# Patient Record
Sex: Female | Born: 1990 | Race: White | Hispanic: No | Marital: Married | Smoking: Never smoker
Health system: Southern US, Community
[De-identification: ages and names within clinical notes are randomized; demographics above are authoritative.]

## PROBLEM LIST (undated history)

## (undated) DIAGNOSIS — R519 Headache, unspecified: Secondary | ICD-10-CM

## (undated) DIAGNOSIS — R51 Headache: Secondary | ICD-10-CM

## (undated) DIAGNOSIS — L709 Acne, unspecified: Secondary | ICD-10-CM

## (undated) HISTORY — DX: Acne, unspecified: L70.9

## (undated) HISTORY — DX: Headache: R51

## (undated) HISTORY — PX: REFRACTIVE SURGERY: SHX103

## (undated) HISTORY — DX: Headache, unspecified: R51.9

## (undated) HISTORY — PX: EYE SURGERY: SHX253

---
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
## 2012-01-18 ENCOUNTER — Emergency Department (HOSPITAL_COMMUNITY)
Admission: EM | Admit: 2012-01-18 | Discharge: 2012-01-18 | Disposition: A | Discharge status: Home / Self Care | Payer: BC Managed Care – PPO | Source: Home / Self Care | Attending: Emergency Medicine | Admitting: Emergency Medicine

## 2013-01-31 HISTORY — PX: BUNIONECTOMY: SHX129

## 2013-04-06 ENCOUNTER — Other Ambulatory Visit: Payer: Self-pay | Admitting: Certified Nurse Midwife

## 2013-04-06 ENCOUNTER — Encounter: Payer: Self-pay | Admitting: Certified Nurse Midwife

## 2013-04-06 ENCOUNTER — Ambulatory Visit (INDEPENDENT_AMBULATORY_CARE_PROVIDER_SITE_OTHER): Payer: BC Managed Care – PPO | Admitting: Certified Nurse Midwife

## 2013-04-06 VITALS — BP 94/60 | HR 60 | Resp 16 | Ht 65.25 in | Wt 107.0 lb

## 2013-04-06 DIAGNOSIS — Z01419 Encounter for gynecological examination (general) (routine) without abnormal findings: Secondary | ICD-10-CM

## 2013-04-06 DIAGNOSIS — Z0000 Encounter for general adult medical examination without abnormal findings: Secondary | ICD-10-CM

## 2013-04-06 DIAGNOSIS — Z309 Encounter for contraceptive management, unspecified: Secondary | ICD-10-CM

## 2013-04-06 MED ORDER — NORGESTIM-ETH ESTRAD TRIPHASIC 0.18/0.215/0.25 MG-25 MCG PO TABS: 1.0000 | ORAL_TABLET | Freq: Every day | ORAL | Status: DC

## 2013-04-06 NOTE — Progress Notes (Signed)
22 y.o. G0P0000 Single Caucasian Fe here for annual exam. Periods normal, no issues with contraception management. Desires OCP continuation. Desires STD screening, HIV,RPR only. Patient had GC, chlamydia previously with this partner. No other health issues today.  Patient's last menstrual period was 04/01/2013.          Sexually active: yes  The current method of family planning is OCP (estrogen/progesterone).    Exercising: yes  running & other cardio Smoker:  no  Health Maintenance: Pap:  2013 normal no history of abnormal MMG:  none Colonoscopy:  none BMD:   none TDaP:  2009 Labs: none Self breast exam: not done   reports that she has never smoked. She has never used smokeless tobacco. She reports that she drinks about 1.0 ounces of alcohol per week. She reports that she does not use illicit drugs.  History reviewed. No pertinent past medical history.  Past Surgical History  Procedure Laterality Date  . Bunionectomy Right 10/14  . Refractive surgery      Current Outpatient Prescriptions  Medication Sig Dispense Refill  . Norgestimate-Ethinyl Estradiol Triphasic (ORTHO TRI-CYCLEN LO) 0.18/0.215/0.25 MG-25 MCG tab Take 1 tablet by mouth daily.       No current facility-administered medications for this visit.    Family History  Problem Relation Age of Onset  . Thyroid disease Mother   . Osteoporosis Maternal Grandmother   . Cancer Maternal Grandfather     prostate  . Stroke Maternal Grandfather     ROS:  Pertinent items are noted in HPI.  Otherwise, a comprehensive ROS was negative.  Exam:   BP 94/60  Pulse 60  Resp 16  Ht 5' 5.25" (1.657 m)  Wt 107 lb (48.535 kg)  BMI 17.68 kg/m2  LMP 04/01/2013 Height: 5' 5.25" (165.7 cm)  Ht Readings from Last 3 Encounters:  04/06/13 5' 5.25" (1.657 m)    General appearance: alert, cooperative and appears stated age Head: Normocephalic, without obvious abnormality, atraumatic Neck: no adenopathy, supple, symmetrical,  trachea midline and thyroid normal to inspection and palpation and non-palpable Lungs: clear to auscultation bilaterally Breasts: normal appearance, no masses or tenderness, No nipple retraction or dimpling, No nipple discharge or bleeding, No axillary or supraclavicular adenopathy Heart: regular rate and rhythm Abdomen: soft, non-tender; no masses,  no organomegaly Extremities: extremities normal, atraumatic, no cyanosis or edema Skin: Skin color, texture, turgor normal. No rashes or lesions Lymph nodes: Cervical, supraclavicular, and axillary nodes normal. No abnormal inguinal nodes palpated Neurologic: Grossly normal   Pelvic: External genitalia:  no lesions              Urethra:  normal appearing urethra with no masses, tenderness or lesions              Bartholin's and Skene's: normal                 Vagina: normal appearing vagina with normal color and discharge, no lesions              Cervix: normal, non tender              Pap taken: yes Bimanual Exam:  Uterus:  normal size, contour, position, consistency, mobility, non-tender and anteflexed              Adnexa: normal adnexa and no mass, fullness, tenderness               Rectovaginal: Confirms  Anus:  normal sphincter tone, no lesions  A:  Well Woman with normal exam  Contraception desires OCP  STD screeening  P:   Reviewed health and wellness pertinent to exam  Rx Orthotricyclen Lo see order  Lab:HIV,RPR  Pap smear as per guidelines   pap smear taken today with reflex  counseled on STD prevention, HIV risk factors and prevention, use and side effects of OCP's, adequate intake of calcium and vitamin D, diet and exercise  return annually or prn  An After Visit Summary was printed and given to the patient.

## 2013-04-06 NOTE — Patient Instructions (Signed)
General topics  Next pap or exam is  due in 1 year Take a Women's multivitamin Take 1200 mg. of calcium daily - prefer dietary If any concerns in interim to call back  Breast Self-Awareness Practicing breast self-awareness may pick up problems early, prevent significant medical complications, and possibly save your life. By practicing breast self-awareness, you can become familiar with how your breasts look and feel and if your breasts are changing. This allows you to notice changes early. It can also offer you some reassurance that your breast health is good. One way to learn what is normal for your breasts and whether your breasts are changing is to do a breast self-exam. If you find a lump or something that was not present in the past, it is best to contact your caregiver right away. Other findings that should be evaluated by your caregiver include nipple discharge, especially if it is bloody; skin changes or reddening; areas where the skin seems to be pulled in (retracted); or new lumps and bumps. Breast pain is seldom associated with cancer (malignancy), but should also be evaluated by a caregiver. BREAST SELF-EXAM The best time to examine your breasts is 5 7 days after your menstrual period is over.  ExitCare Patient Information 2013 [employer].   Exercise to Stay Healthy Exercise helps you become and stay healthy. EXERCISE IDEAS AND TIPS Choose exercises that:  You enjoy.  Fit into your day. You do not need to exercise really hard to be healthy. You can do exercises at a slow or medium level and stay healthy. You can:  Stretch before and after working out.  Try yoga, Pilates, or tai chi.  Lift weights.  Walk fast, swim, jog, run, climb stairs, bicycle, dance, or rollerskate.  Take aerobic classes. Exercises that burn about 150 calories:  Running 1  miles in 15 minutes.  Playing volleyball for 45 to 60 minutes.  Washing and waxing a car for 45 to 60  minutes.  Playing touch football for 45 minutes.  Walking 1  miles in 35 minutes.  Pushing a stroller 1  miles in 30 minutes.  Playing basketball for 30 minutes.  Raking leaves for 30 minutes.  Bicycling 5 miles in 30 minutes.  Walking 2 miles in 30 minutes.  Dancing for 30 minutes.  Shoveling snow for 15 minutes.  Swimming laps for 20 minutes.  Walking up stairs for 15 minutes.  Bicycling 4 miles in 15 minutes.  Gardening for 30 to 45 minutes.  Jumping rope for 15 minutes.  Washing windows or floors for 45 to 60 minutes. Document Released: 05/22/2010 Document Revised: 07/12/2011 Document Reviewed: 05/22/2010 ExitCare Patient Information 2013 [employer].   Other topics ( that may be useful information):    Sexually Transmitted Disease Sexually transmitted disease (STD) refers to any infection that is passed from person to person during sexual activity. This may happen by way of saliva, semen, blood, vaginal mucus, or urine. Common STDs include:  Gonorrhea.  Chlamydia.  Syphilis.  HIV/AIDS.  Genital herpes.  Hepatitis B and C.  Trichomonas.  Human papillomavirus (HPV).  Pubic lice. CAUSES  An STD may be spread by bacteria, virus, or parasite. A person can get an STD by:  Sexual intercourse with an infected person.  Sharing sex toys with an infected person.  Sharing needles with an infected person.  Having intimate contact with the genitals, mouth, or rectal areas of an infected person. SYMPTOMS  Some people may not have any symptoms, but   they can still pass the infection to others. Different STDs have different symptoms. Symptoms include:  Painful or bloody urination.  Pain in the pelvis, abdomen, vagina, anus, throat, or eyes.  Skin rash, itching, irritation, growths, or sores (lesions). These usually occur in the genital or anal area.  Abnormal vaginal discharge.  Penile discharge in men.  Soft, flesh-colored skin growths in the  genital or anal area.  Fever.  Pain or bleeding during sexual intercourse.  Swollen glands in the groin area.  Yellow skin and eyes (jaundice). This is seen with hepatitis. DIAGNOSIS  To make a diagnosis, your caregiver may:  Take a medical history.  Perform a physical exam.  Take a specimen (culture) to be examined.  Examine a sample of discharge under a microscope.  Perform blood test TREATMENT   Chlamydia, gonorrhea, trichomonas, and syphilis can be cured with antibiotic medicine.  Genital herpes, hepatitis, and HIV can be treated, but not cured, with prescribed medicines. The medicines will lessen the symptoms.  Genital warts from HPV can be treated with medicine or by freezing, burning (electrocautery), or surgery. Warts may come back.  HPV is a virus and cannot be cured with medicine or surgery.However, abnormal areas may be followed very closely by your caregiver and may be removed from the cervix, vagina, or vulva through office procedures or surgery. If your diagnosis is confirmed, your recent sexual partners need treatment. This is true even if they are symptom-free or have a negative culture or evaluation. They should not have sex until their caregiver says it is okay. HOME CARE INSTRUCTIONS  All sexual partners should be informed, tested, and treated for all STDs.  Take your antibiotics as directed. Finish them even if you start to feel better.  Only take over-the-counter or prescription medicines for pain, discomfort, or fever as directed by your caregiver.  Rest.  Eat a balanced diet and drink enough fluids to keep your urine clear or pale yellow.  Do not have sex until treatment is completed and you have followed up with your caregiver. STDs should be checked after treatment.  Keep all follow-up appointments, Pap tests, and blood tests as directed by your caregiver.  Only use latex condoms and water-soluble lubricants during sexual activity. Do not use  petroleum jelly or oils.  Avoid alcohol and illegal drugs.  Get vaccinated for HPV and hepatitis. If you have not received these vaccines in the past, talk to your caregiver about whether one or both might be right for you.  Avoid risky sex practices that can break the skin. The only way to avoid getting an STD is to avoid all sexual activity.Latex condoms and dental dams (for oral sex) will help lessen the risk of getting an STD, but will not completely eliminate the risk. SEEK MEDICAL CARE IF:   You have a fever.  You have any new or worsening symptoms. Document Released: 07/10/2002 Document Revised: 07/12/2011 Document Reviewed: 07/17/2010 ExitCare Patient Information 2013 [employer].    Domestic Abuse You are being battered or abused if someone close to you hits, pushes, or physically hurts you in any way. You also are being abused if you are forced into activities. You are being sexually abused if you are forced to have sexual contact of any kind. You are being emotionally abused if you are made to feel worthless or if you are constantly threatened. It is important to remember that help is available. No one has the right to abuse you. PREVENTION OF FURTHER   ABUSE  Learn the warning signs of danger. This varies with situations but may include: the use of alcohol, threats, isolation from friends and family, or forced sexual contact. Leave if you feel that violence is going to occur.  If you are attacked or beaten, report it to the police so the abuse is documented. You do not have to press charges. The police can protect you while you or the attackers are leaving. Get the officer's name and badge number and a copy of the report.  Find someone you can trust and tell them what is happening to you: your caregiver, a nurse, clergy member, close friend or family member. Feeling ashamed is natural, but remember that you have done nothing wrong. No one deserves abuse. Document Released:  04/16/2000 Document Revised: 07/12/2011 Document Reviewed: 06/25/2010 ExitCare Patient Information 2013 [employer].    How Much is Too Much Alcohol? Drinking too much alcohol can cause injury, accidents, and health problems. These types of problems can include:   Car crashes.  Falls.  Family fighting (domestic violence).  Drowning.  Fights.  Injuries.  Burns.  Damage to certain organs.  Having a baby with birth defects. ONE DRINK CAN BE TOO MUCH WHEN YOU ARE:  Working.  Pregnant or breastfeeding.  Taking medicines. Ask your doctor.  Driving or planning to drive. If you or someone you know has a drinking problem, get help from a doctor.  Document Released: 02/13/2009 Document Revised: 07/12/2011 Document Reviewed: 02/13/2009 ExitCare Patient Information 2013 [employer].   Smoking Hazards Smoking cigarettes is extremely bad for your health. Tobacco smoke has over 200 known poisons in it. There are over 60 chemicals in tobacco smoke that cause cancer. Some of the chemicals found in cigarette smoke include:   Cyanide.  Benzene.  Formaldehyde.  Methanol (wood alcohol).  Acetylene (fuel used in welding torches).  Ammonia. Cigarette smoke also contains the poisonous gases nitrogen oxide and carbon monoxide.  Cigarette smokers have an increased risk of many serious medical problems and Smoking causes approximately:  90% of all lung cancer deaths in men.  80% of all lung cancer deaths in women.  90% of deaths from chronic obstructive lung disease. Compared with nonsmokers, smoking increases the risk of:  Coronary heart disease by 2 to 4 times.  Stroke by 2 to 4 times.  Men developing lung cancer by 23 times.  Women developing lung cancer by 13 times.  Dying from chronic obstructive lung diseases by 12 times.  . Smoking is the most preventable cause of death and disease in our society.  WHY IS SMOKING ADDICTIVE?  Nicotine is the chemical  agent in tobacco that is capable of causing addiction or dependence.  When you smoke and inhale, nicotine is absorbed rapidly into the bloodstream through your lungs. Nicotine absorbed through the lungs is capable of creating a powerful addiction. Both inhaled and non-inhaled nicotine may be addictive.  Addiction studies of cigarettes and spit tobacco show that addiction to nicotine occurs mainly during the teen years, when young people begin using tobacco products. WHAT ARE THE BENEFITS OF QUITTING?  There are many health benefits to quitting smoking.   Likelihood of developing cancer and heart disease decreases. Health improvements are seen almost immediately.  Blood pressure, pulse rate, and breathing patterns start returning to normal soon after quitting. QUITTING SMOKING   American Lung Association - 1-800-LUNGUSA  American Cancer Society - 1-800-ACS-2345 Document Released: 05/27/2004 Document Revised: 07/12/2011 Document Reviewed: 01/29/2009 ExitCare Patient Information 2013 ExitCare[employer]   LL[employer].   Stress Management Stress is a state of physical or mental tension that often results from changes in your life or normal routine. Some common causes of stress are:  Death of a loved one.  Injuries or severe illnesses.  Getting fired or changing jobs.  Moving into a new home. Other causes may be:  Sexual problems.  Business or financial losses.  Taking on a large debt.  Regular conflict with someone at home or at work.  Constant tiredness from lack of sleep. It is not just bad things that are stressful. It may be stressful to:  Win the lottery.  Get married.  Buy a new car. The amount of stress that can be easily tolerated varies from person to person. Changes generally cause stress, regardless of the types of change. Too much stress can affect your health. It may lead to physical or emotional problems. Too little stress (boredom) may also become stressful. SUGGESTIONS TO  REDUCE STRESS:  Talk things over with your family and friends. It often is helpful to share your concerns and worries. If you feel your problem is serious, you may want to get help from a professional counselor.  Consider your problems one at a time instead of lumping them all together. Trying to take care of everything at once may seem impossible. List all the things you need to do and then start with the most important one. Set a goal to accomplish 2 or 3 things each day. If you expect to do too many in a single day you will naturally fail, causing you to feel even more stressed.  Do not use alcohol or drugs to relieve stress. Although you may feel better for a short time, they do not remove the problems that caused the stress. They can also be habit forming.  Exercise regularly - at least 3 times per week. Physical exercise can help to relieve that "uptight" feeling and will relax you.  The shortest distance between despair and hope is often a good night's sleep.  Go to bed and get up on time allowing yourself time for appointments without being rushed.  Take a short "time-out" period from any stressful situation that occurs during the day. Close your eyes and take some deep breaths. Starting with the muscles in your face, tense them, hold it for a few seconds, then relax. Repeat this with the muscles in your neck, shoulders, hand, stomach, back and legs.  Take good care of yourself. Eat a balanced diet and get plenty of rest.  Schedule time for having fun. Take a break from your daily routine to relax. HOME CARE INSTRUCTIONS   Call if you feel overwhelmed by your problems and feel you can no longer manage them on your own.  Return immediately if you feel like hurting yourself or someone else. Document Released: 10/13/2000 Document Revised: 07/12/2011 Document Reviewed: 06/05/2007 ExitCare Patient Information 2013 ExitCare[employer].   

## 2013-04-07 LAB — RPR

## 2013-04-09 LAB — PAP, THINPREP RFLX HPV: COMMENTS:: 1

## 2013-04-09 NOTE — Progress Notes (Signed)
Note reviewed, agree with plan.  Lijah Bourque, MD  

## 2013-07-16 ENCOUNTER — Telehealth: Payer: Self-pay | Admitting: Certified Nurse Midwife

## 2013-07-16 DIAGNOSIS — Z309 Encounter for contraceptive management, unspecified: Secondary | ICD-10-CM

## 2013-07-16 NOTE — Telephone Encounter (Signed)
Patient requesting another co-pay exemption for Orthotricyclin Lo. The old exemption has expired.  Grace Zhang

## 2013-07-17 NOTE — Telephone Encounter (Signed)
Patient is waiting to hear back from a nurse regarding her co-pay exemption. Please call today. Patient says no one called her back yesterday.

## 2013-07-18 MED ORDER — ORTHO TRI-CYCLEN LO 0.18/0.215/0.25 MG-25 MCG PO TABS: 1.0000 | ORAL_TABLET | Freq: Every day | ORAL | Status: DC

## 2013-07-18 NOTE — Telephone Encounter (Signed)
Message left to return call to [place] at [phone].   Override obtained for 06/26/13-07/17/14. Case ID [id number] Patient had failure to [place] Loestrin and Lo Lo Estrin FE   Patient returned call she requested 90 day supply. 90 day supply sent to pharmacy.  Routing to provider for final review. Patient agreeable to disposition. Will close encounter

## 2013-07-24 ENCOUNTER — Telehealth: Payer: Self-pay

## 2013-07-24 NOTE — Telephone Encounter (Signed)
Called patient to let her know that her insurance has approved her orth tri-cyclen lo medication. [email or web address]

## 2013-07-26 NOTE — Telephone Encounter (Signed)
Left message for call back.

## 2013-07-26 NOTE — Telephone Encounter (Signed)
Pt notified & appreciates all of the help

## 2013-11-08 ENCOUNTER — Encounter: Payer: Self-pay | Admitting: Family Medicine

## 2013-11-08 ENCOUNTER — Ambulatory Visit (INDEPENDENT_AMBULATORY_CARE_PROVIDER_SITE_OTHER): Payer: BC Managed Care – PPO | Admitting: Family Medicine

## 2013-11-08 VITALS — BP 94/70 | HR 62 | Temp 98.3°F | Ht 65.0 in | Wt 106.5 lb

## 2013-11-08 DIAGNOSIS — H04129 Dry eye syndrome of unspecified lacrimal gland: Secondary | ICD-10-CM

## 2013-11-08 DIAGNOSIS — Z7689 Persons encountering health services in other specified circumstances: Secondary | ICD-10-CM

## 2013-11-08 DIAGNOSIS — Z3009 Encounter for other general counseling and advice on contraception: Secondary | ICD-10-CM

## 2013-11-08 DIAGNOSIS — L709 Acne, unspecified: Secondary | ICD-10-CM

## 2013-11-08 DIAGNOSIS — L708 Other acne: Secondary | ICD-10-CM

## 2013-11-08 DIAGNOSIS — M21611 Bunion of right foot: Secondary | ICD-10-CM | POA: Insufficient documentation

## 2013-11-08 DIAGNOSIS — L700 Acne vulgaris: Secondary | ICD-10-CM

## 2013-11-08 DIAGNOSIS — Z7189 Other specified counseling: Secondary | ICD-10-CM

## 2013-11-08 DIAGNOSIS — IMO0001 Reserved for inherently not codable concepts without codable children: Secondary | ICD-10-CM | POA: Insufficient documentation

## 2013-11-08 DIAGNOSIS — M21619 Bunion of unspecified foot: Secondary | ICD-10-CM

## 2013-11-08 DIAGNOSIS — H04123 Dry eye syndrome of bilateral lacrimal glands: Secondary | ICD-10-CM | POA: Insufficient documentation

## 2013-11-08 HISTORY — DX: Acne, unspecified: L70.9

## 2013-11-08 NOTE — Patient Instructions (Signed)
-  We have ordered labs or studies at this visit. It can take up to 1-2 weeks for results and processing. We will contact you with instructions IF your results are abnormal. Normal results will be released to your [facility]. If you have not heard from [place] or can not find your results in [facility] in 2 weeks please contact our office.  -PLEASE SIGN UP FOR MYCHART TODAY   We recommend the following healthy lifestyle measures: - eat a healthy diet consisting of lots of vegetables, fruits, beans, nuts, seeds, healthy meats such as white chicken and fish and whole grains.  - avoid fried foods, fast food, processed foods, sodas, red meet and other fattening foods.  - get a least 150 minutes of aerobic exercise per week.   Follow up in: 6 months for physical

## 2013-11-08 NOTE — Progress Notes (Signed)
Pre visit review using our clinic review tool, if applicable. No additional management support is needed unless otherwise documented below in the visit note. 

## 2013-11-08 NOTE — Progress Notes (Signed)
No chief complaint on file.   HPI:  Grace Zhang is here to establish care. Recently moved to [place] a few years ago. Last PCP and physical: sees gyn for yearly paps/physical.  Has the following chronic problems and concerns today:  Patient Active Problem List   Diagnosis Date Noted  . Acne - sees dermatologist 11/08/2013  . Contraception - sees gynecologist 11/08/2013  . Bunion of right foot - sees orthopedic foot doctor for this 11/08/2013  . Dry eyes - goes to Dr. Herbert Deaner optho for this 11/08/2013    ROS negative for unless reported above: fevers, unintentional weight loss, hearing or vision loss, chest pain, palpitations, struggling to breath, hemoptysis, melena, hematochezia, hematuria, fals, loc, si, thoughts of self harm  Past Medical History  Diagnosis Date  . Frequent headaches   . Acne - sees dermatologist 11/08/2013    Family History  Problem Relation Age of Onset  . Thyroid disease Mother   . Osteoporosis Maternal Grandmother   . Cancer Maternal Grandfather     prostate  . Stroke Maternal Grandfather     History   Social History  . Marital Status: Single    Spouse Name: N/A    Number of Children: N/A  . Years of Education: N/A   Social History Main Topics  . Smoking status: Never Smoker   . Smokeless tobacco: Never Used  . Alcohol Use: 1.0 oz/week    2 drink(s) per week     Comment: occ  . Drug Use: No  . Sexual Activity: Yes    Partners: Male    Birth Control/ Protection: Pill   Other Topics Concern  . None   Social History Narrative   Work or School: reporter for new station - [employer] Situation: lives alone      Spiritual Beliefs: Catholic      Lifestyle: runs 3- 4 times per week - 1-2 mile; diet is healthy - no body image concerns, anorexia or bulemia per her report                Current outpatient prescriptions:cycloSPORINE (RESTASIS) 0.05 % ophthalmic emulsion, 1 drop 2 (two) times daily., Disp: , Rfl: ;  ORTHO  TRI-CYCLEN LO 0.18/0.215/0.25 MG-25 MCG tab, Take 1 tablet by mouth daily., Disp: 3 Package, Rfl: 3  EXAM:  Filed Vitals:   11/08/13 1435  BP: 94/70  Pulse: 62  Temp: 98.3 F (36.8 C)    Body mass index is 17.72 kg/(m^2).  GENERAL: vitals reviewed and listed above, alert, oriented, appears well hydrated and in no acute distress  HEENT: atraumatic, conjunttiva clear, no obvious abnormalities on inspection of external nose and ears  NECK: no obvious masses on inspection  LUNGS: clear to auscultation bilaterally, no wheezes, rales or rhonchi, good air movement  CV: HRRR, no peripheral edema  MS: moves all extremities without noticeable abnormality  PSYCH: pleasant and cooperative, no obvious depression or anxiety  ASSESSMENT AND PLAN:  Discussed the following assessment and plan:  Acne vulgaris  Encounter for other general counseling or advice on contraception  Bunion of right foot - sees orthopedic foot doctor for this  Dry eyes, bilateral  Encounter to establish care  -We reviewed the PMH, PSH, FH, SH, Meds and Allergies. -We provided refills for any medications we will prescribe as needed. -We addressed current concerns per orders and patient instructions. -We have asked for records for pertinent exams, studies, vaccines and notes from previous providers. -We have  advised patient to follow up per instructions below.   -Patient advised to return or notify a doctor immediately if symptoms worsen or persist or new concerns arise.  Patient Instructions  -We have ordered labs or studies at this visit. It can take up to 1-2 weeks for results and processing. We will contact you with instructions IF your results are abnormal. Normal results will be released to your [facility]. If you have not heard from [place] or can not find your results in [facility] in 2 weeks please contact our office.  -PLEASE SIGN UP FOR MYCHART TODAY   We recommend the following healthy lifestyle  measures: - eat a healthy diet consisting of lots of vegetables, fruits, beans, nuts, seeds, healthy meats such as white chicken and fish and whole grains.  - avoid fried foods, fast food, processed foods, sodas, red meet and other fattening foods.  - get a least 150 minutes of aerobic exercise per week.   Follow up in: 6 months for physical      KIM, HANNAH R.

## 2013-12-13 ENCOUNTER — Telehealth: Payer: Self-pay | Admitting: Certified Nurse Midwife

## 2013-12-13 DIAGNOSIS — Z309 Encounter for contraceptive management, unspecified: Secondary | ICD-10-CM

## 2013-12-13 MED ORDER — ORTHO TRI-CYCLEN LO 0.18/0.215/0.25 MG-25 MCG PO TABS: 1.0000 | ORAL_TABLET | Freq: Every day | ORAL | Status: DC

## 2013-12-13 NOTE — Telephone Encounter (Signed)
Rx transferred to [facility] mail order pharmacy. Spoke with patient. Advised rx sent over to pharmacy of choice. Patient agreeable.  Routing to provider for final review. Patient agreeable to disposition. Will close encounter

## 2013-12-13 NOTE — Telephone Encounter (Signed)
°  Patient is requesting her [place] 0.18/0.215/0.25 MG-25 MCG tab called to her mail order pharmacy  [facility] [phone] option #2   Patient request a call when this has been called in to [facility] mail order.

## 2014-01-08 ENCOUNTER — Encounter: Payer: Self-pay | Admitting: Family Medicine

## 2014-03-06 ENCOUNTER — Encounter: Payer: Self-pay | Admitting: Family Medicine

## 2014-04-04 ENCOUNTER — Encounter: Payer: BC Managed Care – PPO | Admitting: Family Medicine

## 2014-04-04 NOTE — Progress Notes (Signed)
Error   This encounter was created in error - please disregard. 

## 2014-04-08 ENCOUNTER — Ambulatory Visit (INDEPENDENT_AMBULATORY_CARE_PROVIDER_SITE_OTHER): Payer: BC Managed Care – PPO | Admitting: Certified Nurse Midwife

## 2014-04-08 ENCOUNTER — Encounter: Payer: Self-pay | Admitting: Certified Nurse Midwife

## 2014-04-08 VITALS — BP 94/54 | HR 78 | Resp 18 | Ht 65.25 in | Wt 107.8 lb

## 2014-04-08 DIAGNOSIS — Z0000 Encounter for general adult medical examination without abnormal findings: Secondary | ICD-10-CM

## 2014-04-08 DIAGNOSIS — Z3041 Encounter for surveillance of contraceptive pills: Secondary | ICD-10-CM

## 2014-04-08 DIAGNOSIS — Z01419 Encounter for gynecological examination (general) (routine) without abnormal findings: Secondary | ICD-10-CM

## 2014-04-08 LAB — TSH: TSH: 3.171 u[IU]/mL (ref 0.350–4.500)

## 2014-04-08 LAB — HEMOGLOBIN, FINGERSTICK: Hemoglobin, fingerstick: 13.5 g/dL (ref 12.0–16.0)

## 2014-04-08 MED ORDER — ORTHO TRI-CYCLEN LO 0.18/0.215/0.25 MG-25 MCG PO TABS: 1.0000 | ORAL_TABLET | Freq: Every day | ORAL | Status: DC

## 2014-04-08 NOTE — Patient Instructions (Signed)
General topics  Next pap or exam is  due in 1 year Take a Women's multivitamin Take 1200 mg. of calcium daily - prefer dietary If any concerns in interim to call back  Breast Self-Awareness Practicing breast self-awareness may pick up problems early, prevent significant medical complications, and possibly save your life. By practicing breast self-awareness, you can become familiar with how your breasts look and feel and if your breasts are changing. This allows you to notice changes early. It can also offer you some reassurance that your breast health is good. One way to learn what is normal for your breasts and whether your breasts are changing is to do a breast self-exam. If you find a lump or something that was not present in the past, it is best to contact your caregiver right away. Other findings that should be evaluated by your caregiver include nipple discharge, especially if it is bloody; skin changes or reddening; areas where the skin seems to be pulled in (retracted); or new lumps and bumps. Breast pain is seldom associated with cancer (malignancy), but should also be evaluated by a caregiver. BREAST SELF-EXAM The best time to examine your breasts is 5 7 days after your menstrual period is over.  ExitCare Patient Information 2013 ExitCare, LLC.   Exercise to Stay Healthy Exercise helps you become and stay healthy. EXERCISE IDEAS AND TIPS Choose exercises that:  You enjoy.  Fit into your day. You do not need to exercise really hard to be healthy. You can do exercises at a slow or medium level and stay healthy. You can:  Stretch before and after working out.  Try yoga, Pilates, or tai chi.  Lift weights.  Walk fast, swim, jog, run, climb stairs, bicycle, dance, or rollerskate.  Take aerobic classes. Exercises that burn about 150 calories:  Running 1  miles in 15 minutes.  Playing volleyball for 45 to 60 minutes.  Washing and waxing a car for 45 to 60  minutes.  Playing touch football for 45 minutes.  Walking 1  miles in 35 minutes.  Pushing a stroller 1  miles in 30 minutes.  Playing basketball for 30 minutes.  Raking leaves for 30 minutes.  Bicycling 5 miles in 30 minutes.  Walking 2 miles in 30 minutes.  Dancing for 30 minutes.  Shoveling snow for 15 minutes.  Swimming laps for 20 minutes.  Walking up stairs for 15 minutes.  Bicycling 4 miles in 15 minutes.  Gardening for 30 to 45 minutes.  Jumping rope for 15 minutes.  Washing windows or floors for 45 to 60 minutes. Document Released: 05/22/2010 Document Revised: 07/12/2011 Document Reviewed: 05/22/2010 ExitCare Patient Information 2013 ExitCare, LLC.   Other topics ( that may be useful information):    Sexually Transmitted Disease Sexually transmitted disease (STD) refers to any infection that is passed from person to person during sexual activity. This may happen by way of saliva, semen, blood, vaginal mucus, or urine. Common STDs include:  Gonorrhea.  Chlamydia.  Syphilis.  HIV/AIDS.  Genital herpes.  Hepatitis B and C.  Trichomonas.  Human papillomavirus (HPV).  Pubic lice. CAUSES  An STD may be spread by bacteria, virus, or parasite. A person can get an STD by:  Sexual intercourse with an infected person.  Sharing sex toys with an infected person.  Sharing needles with an infected person.  Having intimate contact with the genitals, mouth, or rectal areas of an infected person. SYMPTOMS  Some people may not have any symptoms, but   they can still pass the infection to others. Different STDs have different symptoms. Symptoms include:  Painful or bloody urination.  Pain in the pelvis, abdomen, vagina, anus, throat, or eyes.  Skin rash, itching, irritation, growths, or sores (lesions). These usually occur in the genital or anal area.  Abnormal vaginal discharge.  Penile discharge in men.  Soft, flesh-colored skin growths in the  genital or anal area.  Fever.  Pain or bleeding during sexual intercourse.  Swollen glands in the groin area.  Yellow skin and eyes (jaundice). This is seen with hepatitis. DIAGNOSIS  To make a diagnosis, your caregiver may:  Take a medical history.  Perform a physical exam.  Take a specimen (culture) to be examined.  Examine a sample of discharge under a microscope.  Perform blood test TREATMENT   Chlamydia, gonorrhea, trichomonas, and syphilis can be cured with antibiotic medicine.  Genital herpes, hepatitis, and HIV can be treated, but not cured, with prescribed medicines. The medicines will lessen the symptoms.  Genital warts from HPV can be treated with medicine or by freezing, burning (electrocautery), or surgery. Warts may come back.  HPV is a virus and cannot be cured with medicine or surgery.However, abnormal areas may be followed very closely by your caregiver and may be removed from the cervix, vagina, or vulva through office procedures or surgery. If your diagnosis is confirmed, your recent sexual partners need treatment. This is true even if they are symptom-free or have a negative culture or evaluation. They should not have sex until their caregiver says it is okay. HOME CARE INSTRUCTIONS  All sexual partners should be informed, tested, and treated for all STDs.  Take your antibiotics as directed. Finish them even if you start to feel better.  Only take over-the-counter or prescription medicines for pain, discomfort, or fever as directed by your caregiver.  Rest.  Eat a balanced diet and drink enough fluids to keep your urine clear or pale yellow.  Do not have sex until treatment is completed and you have followed up with your caregiver. STDs should be checked after treatment.  Keep all follow-up appointments, Pap tests, and blood tests as directed by your caregiver.  Only use latex condoms and water-soluble lubricants during sexual activity. Do not use  petroleum jelly or oils.  Avoid alcohol and illegal drugs.  Get vaccinated for HPV and hepatitis. If you have not received these vaccines in the past, talk to your caregiver about whether one or both might be right for you.  Avoid risky sex practices that can break the skin. The only way to avoid getting an STD is to avoid all sexual activity.Latex condoms and dental dams (for oral sex) will help lessen the risk of getting an STD, but will not completely eliminate the risk. SEEK MEDICAL CARE IF:   You have a fever.  You have any new or worsening symptoms. Document Released: 07/10/2002 Document Revised: 07/12/2011 Document Reviewed: 07/17/2010 [facility] Patient Information 2013 [place].    Domestic Abuse You are being battered or abused if someone close to you hits, pushes, or physically hurts you in any way. You also are being abused if you are forced into activities. You are being sexually abused if you are forced to have sexual contact of any kind. You are being emotionally abused if you are made to feel worthless or if you are constantly threatened. It is important to remember that help is available. No one has the right to abuse you. PREVENTION OF FURTHER  ABUSE  Learn the warning signs of danger. This varies with situations but may include: the use of alcohol, threats, isolation from friends and family, or forced sexual contact. Leave if you feel that violence is going to occur.  If you are attacked or beaten, report it to the police so the abuse is documented. You do not have to press charges. The police can protect you while you or the attackers are leaving. Get the officer's name and badge number and a copy of the report.  Find someone you can trust and tell them what is happening to you: your caregiver, a nurse, clergy member, close friend or family member. Feeling ashamed is natural, but remember that you have done nothing wrong. No one deserves abuse. Document Released:  04/16/2000 Document Revised: 07/12/2011 Document Reviewed: 06/25/2010 ExitCare Patient Information 2013 ExitCare, LLC.    How Much is Too Much Alcohol? Drinking too much alcohol can cause injury, accidents, and health problems. These types of problems can include:   Car crashes.  Falls.  Family fighting (domestic violence).  Drowning.  Fights.  Injuries.  Burns.  Damage to certain organs.  Having a baby with birth defects. ONE DRINK CAN BE TOO MUCH WHEN YOU ARE:  Working.  Pregnant or breastfeeding.  Taking medicines. Ask your doctor.  Driving or planning to drive. If you or someone you know has a drinking problem, get help from a doctor.  Document Released: 02/13/2009 Document Revised: 07/12/2011 Document Reviewed: 02/13/2009 ExitCare Patient Information 2013 ExitCare, LLC.   Smoking Hazards Smoking cigarettes is extremely bad for your health. Tobacco smoke has over 200 known poisons in it. There are over 60 chemicals in tobacco smoke that cause cancer. Some of the chemicals found in cigarette smoke include:   Cyanide.  Benzene.  Formaldehyde.  Methanol (wood alcohol).  Acetylene (fuel used in welding torches).  Ammonia. Cigarette smoke also contains the poisonous gases nitrogen oxide and carbon monoxide.  Cigarette smokers have an increased risk of many serious medical problems and Smoking causes approximately:  90% of all lung cancer deaths in men.  80% of all lung cancer deaths in women.  90% of deaths from chronic obstructive lung disease. Compared with nonsmokers, smoking increases the risk of:  Coronary heart disease by 2 to 4 times.  Stroke by 2 to 4 times.  Men developing lung cancer by 23 times.  Women developing lung cancer by 13 times.  Dying from chronic obstructive lung diseases by 12 times.  . Smoking is the most preventable cause of death and disease in our society.  WHY IS SMOKING ADDICTIVE?  Nicotine is the chemical  agent in tobacco that is capable of causing addiction or dependence.  When you smoke and inhale, nicotine is absorbed rapidly into the bloodstream through your lungs. Nicotine absorbed through the lungs is capable of creating a powerful addiction. Both inhaled and non-inhaled nicotine may be addictive.  Addiction studies of cigarettes and spit tobacco show that addiction to nicotine occurs mainly during the teen years, when young people begin using tobacco products. WHAT ARE THE BENEFITS OF QUITTING?  There are many health benefits to quitting smoking.   Likelihood of developing cancer and heart disease decreases. Health improvements are seen almost immediately.  Blood pressure, pulse rate, and breathing patterns start returning to normal soon after quitting. QUITTING SMOKING   American Lung Association - 1-800-LUNGUSA  American Cancer Society - 1-800-ACS-2345 Document Released: 05/27/2004 Document Revised: 07/12/2011 Document Reviewed: 01/29/2009 ExitCare Patient Information 2013 ExitCare,   LLC.   Stress Management Stress is a state of physical or mental tension that often results from changes in your life or normal routine. Some common causes of stress are:  Death of a loved one.  Injuries or severe illnesses.  Getting fired or changing jobs.  Moving into a new home. Other causes may be:  Sexual problems.  Business or financial losses.  Taking on a large debt.  Regular conflict with someone at home or at work.  Constant tiredness from lack of sleep. It is not just bad things that are stressful. It may be stressful to:  Win the lottery.  Get married.  Buy a new car. The amount of stress that can be easily tolerated varies from person to person. Changes generally cause stress, regardless of the types of change. Too much stress can affect your health. It may lead to physical or emotional problems. Too little stress (boredom) may also become stressful. SUGGESTIONS TO  REDUCE STRESS:  Talk things over with your family and friends. It often is helpful to share your concerns and worries. If you feel your problem is serious, you may want to get help from a professional counselor.  Consider your problems one at a time instead of lumping them all together. Trying to take care of everything at once may seem impossible. List all the things you need to do and then start with the most important one. Set a goal to accomplish 2 or 3 things each day. If you expect to do too many in a single day you will naturally fail, causing you to feel even more stressed.  Do not use alcohol or drugs to relieve stress. Although you may feel better for a short time, they do not remove the problems that caused the stress. They can also be habit forming.  Exercise regularly - at least 3 times per week. Physical exercise can help to relieve that "uptight" feeling and will relax you.  The shortest distance between despair and hope is often a good night's sleep.  Go to bed and get up on time allowing yourself time for appointments without being rushed.  Take a short "time-out" period from any stressful situation that occurs during the day. Close your eyes and take some deep breaths. Starting with the muscles in your face, tense them, hold it for a few seconds, then relax. Repeat this with the muscles in your neck, shoulders, hand, stomach, back and legs.  Take good care of yourself. Eat a balanced diet and get plenty of rest.  Schedule time for having fun. Take a break from your daily routine to relax. HOME CARE INSTRUCTIONS   Call if you feel overwhelmed by your problems and feel you can no longer manage them on your own.  Return immediately if you feel like hurting yourself or someone else. Document Released: 10/13/2000 Document Revised: 07/12/2011 Document Reviewed: 06/05/2007 ExitCare Patient Information 2013 ExitCare, LLC.   

## 2014-04-08 NOTE — Progress Notes (Signed)
23 y.o. G0P0000 Single Caucasian Fe here for annual exam.  Periods normal. Contraception of OCP working well. No partner change, no STD concerns. On medication for dry eyes now with help with suspension. Sees urgent care if needed. Request TSH lab due to dry eyes and eye MD suggestion. No other health issues today.  Patient's last menstrual period was 04/01/2014.          Sexually active: Yes.    The current method of family planning is OCP (estrogen/progesterone).    Exercising: Yes.    Running 3-4x/wk Smoker:  no  Health Maintenance: Pap:  04/06/13 Neg TDaP:  09/01/2007  Labs: Hgb: 13.5 ; Urine: Declined    reports that she has never smoked. She has never used smokeless tobacco. She reports that she drinks about 1.2 - 1.8 oz of alcohol per week. She reports that she does not use illicit drugs.  Past Medical History  Diagnosis Date  . Frequent headaches   . Acne - sees dermatologist 11/08/2013    Past Surgical History  Procedure Laterality Date  . Bunionectomy Right 10/14  . Refractive surgery      Current Outpatient Prescriptions  Medication Sig Dispense Refill  . ORTHO TRI-CYCLEN LO 0.18/0.215/0.25 MG-25 MCG tab Take 1 tablet by mouth daily. 3 Package 3  . prednisoLONE acetate (PRED FORTE) 1 % ophthalmic suspension daily.  4   No current facility-administered medications for this visit.    Family History  Problem Relation Age of Onset  . Thyroid disease Mother   . Osteoporosis Maternal Grandmother   . Cancer Maternal Grandfather     prostate  . Stroke Maternal Grandfather     ROS:  Pertinent items are noted in HPI.  Otherwise, a comprehensive ROS was negative.  Exam:   Ht 5' 5.25" (1.657 m)  Wt 107 lb 12.8 oz (48.898 kg)  BMI 17.81 kg/m2  LMP 04/01/2014 Height: 5' 5.25" (165.7 cm)  Ht Readings from Last 3 Encounters:  04/08/14 5' 5.25" (1.657 m)  11/08/13 5\' 5"  (1.651 m)  04/06/13 5' 5.25" (1.657 m)    General appearance: alert, cooperative and appears stated  age Head: Normocephalic, without obvious abnormality, atraumatic Neck: no adenopathy, supple, symmetrical, trachea midline and thyroid normal to inspection and palpation and non-palpable Lungs: clear to auscultation bilaterally Breasts: normal appearance, no masses or tenderness, Inspection negative, No nipple retraction or dimpling, No nipple discharge or bleeding Heart: regular rate and rhythm Abdomen: soft, non-tender; no masses,  no organomegaly Extremities: extremities normal, atraumatic, no cyanosis or edema Skin: Skin color, texture, turgor normal. No rashes or lesions Lymph nodes: Cervical, supraclavicular, and axillary nodes normal. No abnormal inguinal nodes palpated Neurologic: Grossly normal   Pelvic: External genitalia:  no lesions              Urethra:  normal appearing urethra with no masses, tenderness or lesions              Bartholin's and Skene's: normal                 Vagina: normal appearing vagina with normal color and discharge, no lesions              Cervix: normal, non tender              Pap taken: No. Bimanual Exam:  Uterus:  normal size, contour, position, consistency, mobility, non-tender and anteverted              Adnexa: normal adnexa  and no mass, fullness, tenderness               Rectovaginal: Confirms               Anus:  normal sphincter tone, no lesions  A:  Well Woman with normal exam  Contraception OCP desired.  Dry eyes with emulsion therapy needs TSH  P:   Reviewed health and wellness pertinent to exam  Rx Ortho tricyclen Lo see order  Lab TSH  Pap smear not taken today   counseled on breast self exam, STD prevention, HIV risk factors and prevention, adequate intake of calcium and vitamin D, diet and exercise  return annually or prn  An After Visit Summary was printed and given to the patient.

## 2014-04-10 NOTE — Progress Notes (Signed)
Reviewed personally.  M. Suzanne Brynne Doane, MD.  

## 2014-09-10 ENCOUNTER — Telehealth: Payer: Self-pay | Admitting: Certified Nurse Midwife

## 2014-09-10 NOTE — Telephone Encounter (Signed)
Patient is asking for a prescription authorization override for birth control prescription. [facility] [phone]

## 2014-09-11 NOTE — Telephone Encounter (Signed)
Called [facility] at [phone], representative Butch Penny states I will need to call [phone] to request authorization.  Brand Ortho tri cyclen lo 84 tablets for 84 days.  Patient had failure to [place] Loestrin and Lo Lo Estrin FE.   30 minutes on hold with with [facility], obtained Authorization for tier exception: Authorization number [id number] good from 08/21/14 to 09/11/2015. Patient notified and given number. Advised to call back with any issues. Patient agreeable.    Routing to provider for final review. Patient agreeable to disposition.   Will close encounter.

## 2014-09-11 NOTE — Telephone Encounter (Signed)
Patient is calling to check the status of her prescription override request. She only has a couple of pills lefts.

## 2014-12-02 ENCOUNTER — Telehealth: Payer: Self-pay | Admitting: Certified Nurse Midwife

## 2014-12-02 NOTE — Telephone Encounter (Signed)
Pt would like to know if there is a generic version available for exact equivalent for her ortho tricycline before requesting a refill. Patient requests call to discuss prior to sending anything to her pharmacy.  [facility] [address] [employer]

## 2014-12-02 NOTE — Telephone Encounter (Signed)
Spoke with patient. Patient is asking if there is a generic of the Orth Tri Cyclen Lo that is the exact equivalent. Advised there are generic versions but there are no medications that are exact. They will have the same estrogen and progesterone combination but may not be manufactured identically. Advised it varies by patient on how she does with brand verse generic medications. Patient states that she would like to stay on the brand name only at this time and will let us know if she would like a generic OCP in the future.  Routing to provider for final review. Patient agreeable to disposition. Will close encounter.   Patient aware provider will review message and nurse will return call if any additional advice or change of disposition.

## 2015-03-10 ENCOUNTER — Telehealth: Payer: Self-pay | Admitting: Certified Nurse Midwife

## 2015-03-10 NOTE — Telephone Encounter (Addendum)
Patient left message on our voicemail after hours Friday. She would like Ms. Debbie to write her a letter of medical necessity. She said she did it for her before but she needs another one for her new provider. Best contact # [phone] (Already left message for patient to give [place] a call back to get more info.)

## 2015-03-19 NOTE — Telephone Encounter (Signed)
Patient left a message checking on the status of this letter, patient asked if we could give her a call back and she would give [place] the information in regards to the letter and where we could send it to.  Left Message To Call Back

## 2015-03-19 NOTE — Telephone Encounter (Signed)
Patient called back and she said her insurance company [employer] through [employer] is needing a letter of neccesity faxed to [phone] ; their phone number is [phone]. The letter needs to explain why she needs ortho tri-cyclen lo, she also needs Ms. Debbie to state in letter to waive brand penalty. Needs to have name/dob basically sayin the reason why she needs to take ortho tri cyclen lo.  Any question you can call patient directly-[phone]

## 2015-03-19 NOTE — Telephone Encounter (Signed)
Spoke with [employer] who states that the patient has tier exception for brand name only Ortho Ti Cyclen Lo until 09/2015. Call to patient. Advised of message from [place]. Patient states that she has switched plans and needs a new authorization. Advised we only have her old insurance on file. Patient will return call as she is at the gym to provide new insurance information for tier exception for brand name Ortho Tri Cyclen Lo.

## 2015-03-20 NOTE — Telephone Encounter (Signed)
Spoke with patient. Patient states that she still has [employer] but it is through her new employer [employer]. ID number is [id number] Group # is [id number]. Advised I will contact her insurance company to verify what is needed and will return call once everything is completed. Patient is agreeable.

## 2015-03-20 NOTE — Telephone Encounter (Signed)
Spoke with patient. Advised signed letter of appeal has been faxed to [facility] at [phone] with cover sheet and confirmation. Will await response from the insurance company regarding appeal. Will notify once we have been notified of approval or denial.

## 2015-03-20 NOTE — Telephone Encounter (Signed)
Patient returned your call last night after hours she has the insurance info that you needed. [phone]

## 2015-03-20 NOTE — Telephone Encounter (Signed)
Spoke with representative at [employer] who states that the patient's medication is covered under her plan, but she has to pay 100 % of the cost unless she uses generic medication. Advised needs brand only. Representative states that there is no tier exception or PA needed. States we may appeal stating she needs to have the brand name only medication. Call to appeals departement at [phone]. Letter needs to be sent to [phone].  Appeal letter written and to Grace Zhang CNM fore review and signature before fax.

## 2015-03-26 ENCOUNTER — Telehealth: Payer: Self-pay

## 2015-03-26 NOTE — Telephone Encounter (Signed)
Spoke with patient. Advised we have received a letter from [place] regarding appeal letter for coverage of her Ortho Tri-Cylen Lo OCP. Advised this will be covered from 03/25/2015-03/24/2018 as long as her plan benefits do not change. Patient is agreeable.  Routing to provider for final review. Patient agreeable to disposition. Will close encounter.

## 2015-04-10 ENCOUNTER — Encounter: Payer: Self-pay | Admitting: Certified Nurse Midwife

## 2015-04-10 ENCOUNTER — Ambulatory Visit (INDEPENDENT_AMBULATORY_CARE_PROVIDER_SITE_OTHER): Payer: BLUE CROSS/BLUE SHIELD | Admitting: Certified Nurse Midwife

## 2015-04-10 VITALS — BP 90/60 | HR 68 | Resp 16 | Ht 65.5 in | Wt 108.0 lb

## 2015-04-10 DIAGNOSIS — Z01419 Encounter for gynecological examination (general) (routine) without abnormal findings: Secondary | ICD-10-CM | POA: Diagnosis not present

## 2015-04-10 DIAGNOSIS — Z3041 Encounter for surveillance of contraceptive pills: Secondary | ICD-10-CM | POA: Diagnosis not present

## 2015-04-10 MED ORDER — ORTHO TRI-CYCLEN LO 0.18/0.215/0.25 MG-25 MCG PO TABS: 1.0000 | ORAL_TABLET | Freq: Every day | ORAL | Status: DC

## 2015-04-10 NOTE — Patient Instructions (Signed)
General topics  Next pap or exam is  due in 1 year Take a Women's multivitamin Take 1200 mg. of calcium daily - prefer dietary If any concerns in interim to call back  Breast Self-Awareness Practicing breast self-awareness may pick up problems early, prevent significant medical complications, and possibly save your life. By practicing breast self-awareness, you can become familiar with how your breasts look and feel and if your breasts are changing. This allows you to notice changes early. It can also offer you some reassurance that your breast health is good. One way to learn what is normal for your breasts and whether your breasts are changing is to do a breast self-exam. If you find a lump or something that was not present in the past, it is best to contact your caregiver right away. Other findings that should be evaluated by your caregiver include nipple discharge, especially if it is bloody; skin changes or reddening; areas where the skin seems to be pulled in (retracted); or new lumps and bumps. Breast pain is seldom associated with cancer (malignancy), but should also be evaluated by a caregiver. BREAST SELF-EXAM The best time to examine your breasts is 5 7 days after your menstrual period is over.  ExitCare Patient Information 2013 [employer].   Exercise to Stay Healthy Exercise helps you become and stay healthy. EXERCISE IDEAS AND TIPS Choose exercises that:  You enjoy.  Fit into your day. You do not need to exercise really hard to be healthy. You can do exercises at a slow or medium level and stay healthy. You can:  Stretch before and after working out.  Try yoga, Pilates, or tai chi.  Lift weights.  Walk fast, swim, jog, run, climb stairs, bicycle, dance, or rollerskate.  Take aerobic classes. Exercises that burn about 150 calories:  Running 1  miles in 15 minutes.  Playing volleyball for 45 to 60 minutes.  Washing and waxing a car for 45 to 60  minutes.  Playing touch football for 45 minutes.  Walking 1  miles in 35 minutes.  Pushing a stroller 1  miles in 30 minutes.  Playing basketball for 30 minutes.  Raking leaves for 30 minutes.  Bicycling 5 miles in 30 minutes.  Walking 2 miles in 30 minutes.  Dancing for 30 minutes.  Shoveling snow for 15 minutes.  Swimming laps for 20 minutes.  Walking up stairs for 15 minutes.  Bicycling 4 miles in 15 minutes.  Gardening for 30 to 45 minutes.  Jumping rope for 15 minutes.  Washing windows or floors for 45 to 60 minutes. Document Released: 05/22/2010 Document Revised: 07/12/2011 Document Reviewed: 05/22/2010 ExitCare Patient Information 2013 [employer].   Other topics ( that may be useful information):    Sexually Transmitted Disease Sexually transmitted disease (STD) refers to any infection that is passed from person to person during sexual activity. This may happen by way of saliva, semen, blood, vaginal mucus, or urine. Common STDs include:  Gonorrhea.  Chlamydia.  Syphilis.  HIV/AIDS.  Genital herpes.  Hepatitis B and C.  Trichomonas.  Human papillomavirus (HPV).  Pubic lice. CAUSES  An STD may be spread by bacteria, virus, or parasite. A person can get an STD by:  Sexual intercourse with an infected person.  Sharing sex toys with an infected person.  Sharing needles with an infected person.  Having intimate contact with the genitals, mouth, or rectal areas of an infected person. SYMPTOMS  Some people may not have any symptoms, but   they can still pass the infection to others. Different STDs have different symptoms. Symptoms include:  Painful or bloody urination.  Pain in the pelvis, abdomen, vagina, anus, throat, or eyes.  Skin rash, itching, irritation, growths, or sores (lesions). These usually occur in the genital or anal area.  Abnormal vaginal discharge.  Penile discharge in men.  Soft, flesh-colored skin growths in the  genital or anal area.  Fever.  Pain or bleeding during sexual intercourse.  Swollen glands in the groin area.  Yellow skin and eyes (jaundice). This is seen with hepatitis. DIAGNOSIS  To make a diagnosis, your caregiver may:  Take a medical history.  Perform a physical exam.  Take a specimen (culture) to be examined.  Examine a sample of discharge under a microscope.  Perform blood test TREATMENT   Chlamydia, gonorrhea, trichomonas, and syphilis can be cured with antibiotic medicine.  Genital herpes, hepatitis, and HIV can be treated, but not cured, with prescribed medicines. The medicines will lessen the symptoms.  Genital warts from HPV can be treated with medicine or by freezing, burning (electrocautery), or surgery. Warts may come back.  HPV is a virus and cannot be cured with medicine or surgery.However, abnormal areas may be followed very closely by your caregiver and may be removed from the cervix, vagina, or vulva through office procedures or surgery. If your diagnosis is confirmed, your recent sexual partners need treatment. This is true even if they are symptom-free or have a negative culture or evaluation. They should not have sex until their caregiver says it is okay. HOME CARE INSTRUCTIONS  All sexual partners should be informed, tested, and treated for all STDs.  Take your antibiotics as directed. Finish them even if you start to feel better.  Only take over-the-counter or prescription medicines for pain, discomfort, or fever as directed by your caregiver.  Rest.  Eat a balanced diet and drink enough fluids to keep your urine clear or pale yellow.  Do not have sex until treatment is completed and you have followed up with your caregiver. STDs should be checked after treatment.  Keep all follow-up appointments, Pap tests, and blood tests as directed by your caregiver.  Only use latex condoms and water-soluble lubricants during sexual activity. Do not use  petroleum jelly or oils.  Avoid alcohol and illegal drugs.  Get vaccinated for HPV and hepatitis. If you have not received these vaccines in the past, talk to your caregiver about whether one or both might be right for you.  Avoid risky sex practices that can break the skin. The only way to avoid getting an STD is to avoid all sexual activity.Latex condoms and dental dams (for oral sex) will help lessen the risk of getting an STD, but will not completely eliminate the risk. SEEK MEDICAL CARE IF:   You have a fever.  You have any new or worsening symptoms. Document Released: 07/10/2002 Document Revised: 07/12/2011 Document Reviewed: 07/17/2010 [facility] Patient Information 2013 [place].    Domestic Abuse You are being battered or abused if someone close to you hits, pushes, or physically hurts you in any way. You also are being abused if you are forced into activities. You are being sexually abused if you are forced to have sexual contact of any kind. You are being emotionally abused if you are made to feel worthless or if you are constantly threatened. It is important to remember that help is available. No one has the right to abuse you. PREVENTION OF FURTHER  ABUSE  Learn the warning signs of danger. This varies with situations but may include: the use of alcohol, threats, isolation from friends and family, or forced sexual contact. Leave if you feel that violence is going to occur.  If you are attacked or beaten, report it to the police so the abuse is documented. You do not have to press charges. The police can protect you while you or the attackers are leaving. Get the officer's name and badge number and a copy of the report.  Find someone you can trust and tell them what is happening to you: your caregiver, a nurse, clergy member, close friend or family member. Feeling ashamed is natural, but remember that you have done nothing wrong. No one deserves abuse. Document Released:  04/16/2000 Document Revised: 07/12/2011 Document Reviewed: 06/25/2010 ExitCare Patient Information 2013 [employer].    How Much is Too Much Alcohol? Drinking too much alcohol can cause injury, accidents, and health problems. These types of problems can include:   Car crashes.  Falls.  Family fighting (domestic violence).  Drowning.  Fights.  Injuries.  Burns.  Damage to certain organs.  Having a baby with birth defects. ONE DRINK CAN BE TOO MUCH WHEN YOU ARE:  Working.  Pregnant or breastfeeding.  Taking medicines. Ask your doctor.  Driving or planning to drive. If you or someone you know has a drinking problem, get help from a doctor.  Document Released: 02/13/2009 Document Revised: 07/12/2011 Document Reviewed: 02/13/2009 ExitCare Patient Information 2013 [employer].   Smoking Hazards Smoking cigarettes is extremely bad for your health. Tobacco smoke has over 200 known poisons in it. There are over 60 chemicals in tobacco smoke that cause cancer. Some of the chemicals found in cigarette smoke include:   Cyanide.  Benzene.  Formaldehyde.  Methanol (wood alcohol).  Acetylene (fuel used in welding torches).  Ammonia. Cigarette smoke also contains the poisonous gases nitrogen oxide and carbon monoxide.  Cigarette smokers have an increased risk of many serious medical problems and Smoking causes approximately:  90% of all lung cancer deaths in men.  80% of all lung cancer deaths in women.  90% of deaths from chronic obstructive lung disease. Compared with nonsmokers, smoking increases the risk of:  Coronary heart disease by 2 to 4 times.  Stroke by 2 to 4 times.  Men developing lung cancer by 23 times.  Women developing lung cancer by 13 times.  Dying from chronic obstructive lung diseases by 12 times.  . Smoking is the most preventable cause of death and disease in our society.  WHY IS SMOKING ADDICTIVE?  Nicotine is the chemical  agent in tobacco that is capable of causing addiction or dependence.  When you smoke and inhale, nicotine is absorbed rapidly into the bloodstream through your lungs. Nicotine absorbed through the lungs is capable of creating a powerful addiction. Both inhaled and non-inhaled nicotine may be addictive.  Addiction studies of cigarettes and spit tobacco show that addiction to nicotine occurs mainly during the teen years, when young people begin using tobacco products. WHAT ARE THE BENEFITS OF QUITTING?  There are many health benefits to quitting smoking.   Likelihood of developing cancer and heart disease decreases. Health improvements are seen almost immediately.  Blood pressure, pulse rate, and breathing patterns start returning to normal soon after quitting. QUITTING SMOKING   American Lung Association - 1-800-LUNGUSA  American Cancer Society - 1-800-ACS-2345 Document Released: 05/27/2004 Document Revised: 07/12/2011 Document Reviewed: 01/29/2009 ExitCare Patient Information 2013 ExitCare,   LLC.   Stress Management Stress is a state of physical or mental tension that often results from changes in your life or normal routine. Some common causes of stress are:  Death of a loved one.  Injuries or severe illnesses.  Getting fired or changing jobs.  Moving into a new home. Other causes may be:  Sexual problems.  Business or financial losses.  Taking on a large debt.  Regular conflict with someone at home or at work.  Constant tiredness from lack of sleep. It is not just bad things that are stressful. It may be stressful to:  Win the lottery.  Get married.  Buy a new car. The amount of stress that can be easily tolerated varies from person to person. Changes generally cause stress, regardless of the types of change. Too much stress can affect your health. It may lead to physical or emotional problems. Too little stress (boredom) may also become stressful. SUGGESTIONS TO  REDUCE STRESS:  Talk things over with your family and friends. It often is helpful to share your concerns and worries. If you feel your problem is serious, you may want to get help from a professional counselor.  Consider your problems one at a time instead of lumping them all together. Trying to take care of everything at once may seem impossible. List all the things you need to do and then start with the most important one. Set a goal to accomplish 2 or 3 things each day. If you expect to do too many in a single day you will naturally fail, causing you to feel even more stressed.  Do not use alcohol or drugs to relieve stress. Although you may feel better for a short time, they do not remove the problems that caused the stress. They can also be habit forming.  Exercise regularly - at least 3 times per week. Physical exercise can help to relieve that "uptight" feeling and will relax you.  The shortest distance between despair and hope is often a good night's sleep.  Go to bed and get up on time allowing yourself time for appointments without being rushed.  Take a short "time-out" period from any stressful situation that occurs during the day. Close your eyes and take some deep breaths. Starting with the muscles in your face, tense them, hold it for a few seconds, then relax. Repeat this with the muscles in your neck, shoulders, hand, stomach, back and legs.  Take good care of yourself. Eat a balanced diet and get plenty of rest.  Schedule time for having fun. Take a break from your daily routine to relax. HOME CARE INSTRUCTIONS   Call if you feel overwhelmed by your problems and feel you can no longer manage them on your own.  Return immediately if you feel like hurting yourself or someone else. Document Released: 10/13/2000 Document Revised: 07/12/2011 Document Reviewed: 06/05/2007 [facility] Patient Information 2013 [place].  Oral Contraception Information Oral contraceptive  pills (OCPs) are medicines taken to prevent pregnancy. OCPs work by preventing the ovaries from releasing eggs. The hormones in OCPs also cause the cervical mucus to thicken, preventing the sperm from entering the uterus. The hormones also cause the uterine lining to become thin, not allowing a fertilized egg to attach to the inside of the uterus. OCPs are highly effective when taken exactly as prescribed. However, OCPs do not prevent sexually transmitted diseases (STDs). Safe sex practices, such as using condoms along with the pill, can help prevent STDs.  Before taking the pill, you may have a physical exam and Pap test. Your health care provider may order blood tests. The health care provider will make sure you are a good candidate for oral contraception. Discuss with your health care provider the possible side effects of the OCP you may be prescribed. When starting an OCP, it can take 2 to 3 months for the body to adjust to the changes in hormone levels in your body.  TYPES OF ORAL CONTRACEPTION  The combination pill--This pill contains estrogen and progestin (synthetic progesterone) hormones. The combination pill comes in 21-day, 28-day, or 91-day packs. Some types of combination pills are meant to be taken continuously (365-day pills). With 21-day packs, you do not take pills for 7 days after the last pill. With 28-day packs, the pill is taken every day. The last 7 pills are without hormones. Certain types of pills have more than 21 hormone-containing pills. With 91-day packs, the first 84 pills contain both hormones, and the last 7 pills contain no hormones or contain estrogen only.  The minipill--This pill contains the progesterone hormone only. The pill is taken every day continuously. It is very important to take the pill at the same time each day. The minipill comes in packs of 28 pills. All 28 pills contain the hormone.  ADVANTAGES OF ORAL CONTRACEPTIVE PILLS  Decreases premenstrual symptoms.    Treats menstrual period cramps.   Regulates the menstrual cycle.   Decreases a heavy menstrual flow.   May treatacne, depending on the type of pill.   Treats abnormal uterine bleeding.   Treats polycystic ovarian syndrome.   Treats endometriosis.   Can be used as emergency contraception.  THINGS THAT CAN MAKE ORAL CONTRACEPTIVE PILLS LESS EFFECTIVE OCPs can be less effective if:   You forget to take the pill at the same time every day.   You have a stomach or intestinal disease that lessens the absorption of the pill.   You take OCPs with other medicines that make OCPs less effective, such as antibiotics, certain HIV medicines, and some seizure medicines.   You take expired OCPs.   You forget to restart the pill on day 7, when using the packs of 21 pills.  RISKS ASSOCIATED WITH ORAL CONTRACEPTIVE PILLS  Oral contraceptive pills can sometimes cause side effects, such as:  Headache.  Nausea.  Breast tenderness.  Irregular bleeding or spotting. Combination pills are also associated with a small increased risk of:  Blood clots.  Heart attack.  Stroke.   This information is not intended to replace advice given to you by your health care provider. Make sure you discuss any questions you have with your health care provider.   Document Released: 07/10/2002 Document Revised: 02/07/2013 Document Reviewed: 10/08/2012 Elsevier Interactive Patient Education 2016 Elsevier Inc.  

## 2015-04-10 NOTE — Progress Notes (Signed)
24 y.o. G0P0000 Single  Caucasian Fe here for annual exam. No partner change, no STD screening. Established care with Colin Benton, sees prn.  Staying busy with news journalist position. Contraception working well. Still having headaches after being up long hours only. Has increased hydration and working on not taking naps to sleep better which seems to be helping. No other health issues today.  Patient's last menstrual period was 03/24/2015.          Sexually active: Yes.    The current method of family planning is OCP (estrogen/progesterone).    Exercising: Yes.    cardio Smoker:  no  Health Maintenance: Pap:  04-06-13 neg MMG:  none Colonoscopy:  none BMD:   none TDaP:  2009 Labs: none Self breast exam: done occ   reports that she has never smoked. She has never used smokeless tobacco. She reports that she drinks about 1.2 oz of alcohol per week. She reports that she does not use illicit drugs.  Past Medical History  Diagnosis Date  . Frequent headaches   . Acne - sees dermatologist 11/08/2013    Past Surgical History  Procedure Laterality Date  . Bunionectomy Right 10/14  . Refractive surgery      Current Outpatient Prescriptions  Medication Sig Dispense Refill  . ORTHO TRI-CYCLEN LO 0.18/0.215/0.25 MG-25 MCG tab Take 1 tablet by mouth daily. 3 Package 4   No current facility-administered medications for this visit.    Family History  Problem Relation Age of Onset  . Thyroid disease Mother   . Osteoporosis Maternal Grandmother   . Cancer Maternal Grandfather     prostate  . Stroke Maternal Grandfather     ROS:  Pertinent items are noted in HPI.  Otherwise, a comprehensive ROS was negative.  Exam:   BP 90/60 mmHg  Pulse 68  Resp 16  Ht 5' 5.5" (1.664 m)  Wt 108 lb (48.988 kg)  BMI 17.69 kg/m2  LMP 03/24/2015 Height: 5' 5.5" (166.4 cm) Ht Readings from Last 3 Encounters:  04/10/15 5' 5.5" (1.664 m)  04/08/14 5' 5.25" (1.657 m)  11/08/13 5\' 5"  (1.651 m)     General appearance: alert, cooperative and appears stated age Head: Normocephalic, without obvious abnormality, atraumatic Neck: no adenopathy, supple, symmetrical, trachea midline and thyroid normal to inspection and palpation Lungs: clear to auscultation bilaterally Breasts: normal appearance, no masses or tenderness, No nipple retraction or dimpling, No nipple discharge or bleeding, No axillary or supraclavicular adenopathy Heart: regular rate and rhythm Abdomen: soft, non-tender; no masses,  no organomegaly Extremities: extremities normal, atraumatic, no cyanosis or edema Skin: Skin color, texture, turgor normal. No rashes or lesions Lymph nodes: Cervical, supraclavicular, and axillary nodes normal. No abnormal inguinal nodes palpated Neurologic: Grossly normal   Pelvic: External genitalia:  no lesions              Urethra:  normal appearing urethra with no masses, tenderness or lesions              Bartholin's and Skene's: normal                 Vagina: normal appearing vagina with normal color and discharge, no lesions              Cervix: normal, non tender, no lesions              Pap taken: No. Bimanual Exam:  Uterus:  normal size, contour, position, consistency, mobility, non-tender  Adnexa: normal adnexa and no mass, fullness, tenderness               Rectovaginal: Confirms               Anus:  normal appearance  Chaperone present: yes  A:  Well Woman with normal exam  Contraception OCP desired, needs brand name,has problems with mood changes on generic  P:   Reviewed health and wellness pertinent to exam  Rx Ortho Tri-cyclen lo see order brand name only  Pap smear as above not taken   counseled on breast self exam, STD prevention, HIV risk factors and prevention, use and side effects of OCP's, adequate intake of calcium and vitamin D, diet and exercise  return annually or prn  An After Visit Summary was printed and given to the patient.

## 2015-04-14 ENCOUNTER — Encounter: Payer: Self-pay | Admitting: Family Medicine

## 2015-04-14 ENCOUNTER — Ambulatory Visit (INDEPENDENT_AMBULATORY_CARE_PROVIDER_SITE_OTHER): Payer: BLUE CROSS/BLUE SHIELD | Admitting: Family Medicine

## 2015-04-14 VITALS — BP 90/62 | HR 72 | Temp 98.1°F | Ht 65.5 in | Wt 109.6 lb

## 2015-04-14 DIAGNOSIS — R202 Paresthesia of skin: Secondary | ICD-10-CM

## 2015-04-14 DIAGNOSIS — R51 Headache: Secondary | ICD-10-CM

## 2015-04-14 DIAGNOSIS — R519 Headache, unspecified: Secondary | ICD-10-CM

## 2015-04-14 NOTE — Progress Notes (Signed)
Pre visit review using our clinic review tool, if applicable. No additional management support is needed unless otherwise documented below in the visit note. 

## 2015-04-14 NOTE — Patient Instructions (Signed)
BEFORE YOU LEAVE: -labs -schedule follow up in 1 month  Get MRI  -We have ordered labs or studies at this visit. It can take up to 1-2 weeks for results and processing. We will contact you with instructions IF your results are abnormal. Normal results will be released to your [facility]. If you have not heard from [place] or can not find your results in [facility] in 2 weeks please contact our office.  -keep headache journal  -ok to use ibuprofen or excedrin up to 2-3 times per week for headaches if needed

## 2015-04-14 NOTE — Progress Notes (Signed)
HPI:  Grace Zhang is a pleasant 24 yo F patient whom I have seen once in the past, with a PMH sig for frequent headaches and dry eyes (sees optho) here for an acute visit for:  Headaches: -reports usually at baseline has 1-2 headaches per week relevied with ibuprofen or excedrin -for the last several weeks has had 3-4 headaches per week, bilat, band around head associated with brief tingling sensation in entire face and entire UEs bilat and blurry vision -symptoms resolve with ibuprofen -denies: weakness, numbness otherwise, fevers, malaise, nvd, double vision, weight changes -is always under a lot of stress but denies anxiety or depression -reports has seen her opthomologist in last 1 month and told her about these symptoms and she told her to see me -father and brothers have migraines and have similar symptoms with their migraines   ROS: See pertinent positives and negatives per HPI.  Past Medical History  Diagnosis Date  . Frequent headaches   . Acne - sees dermatologist 11/08/2013    Past Surgical History  Procedure Laterality Date  . Bunionectomy Right 10/14  . Refractive surgery      Family History  Problem Relation Age of Onset  . Thyroid disease Mother   . Osteoporosis Maternal Grandmother   . Cancer Maternal Grandfather     prostate  . Stroke Maternal Grandfather     Social History   Social History  . Marital Status: Single    Spouse Name: N/A  . Number of Children: N/A  . Years of Education: N/A   Social History Main Topics  . Smoking status: Never Smoker   . Smokeless tobacco: Never Used  . Alcohol Use: 1.2 oz/week    2 Standard drinks or equivalent per week  . Drug Use: No  . Sexual Activity:    Partners: Male    Birth Control/ Protection: Pill   Other Topics Concern  . 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  Carboxymethylcell-Hypromellose (GENTEAL OP), Apply to eye., Disp: , Rfl:  .  ORTHO TRI-CYCLEN LO 0.18/0.215/0.25 MG-25 MCG tab, Take 1 tablet by mouth daily., Disp: 1 Package, Rfl: 12  EXAM:  Filed Vitals:   04/14/15 1621  BP: 90/62  Pulse: 72  Temp: 98.1 F (36.7 C)    Body mass index is 17.95 kg/(m^2).  GENERAL: vitals reviewed and listed above, alert, oriented, appears well hydrated and in no acute distress  HEENT: atraumatic, conjunttiva clear, PERRLA, visual acuity grossly intact, no obvious abnormalities on inspection of external nose and ears  NECK: no obvious masses on inspection  LUNGS: clear to auscultation bilaterally, no wheezes, rales or rhonchi, good air movement  CV: HRRR, no peripheral edema  MS: moves all extremities without noticeable abnormality  NEURO: CN II-XII grossly intact, finger to nose normal  PSYCH: pleasant and cooperative, no obvious depression or anxiety  ASSESSMENT AND PLAN:  Discussed the following assessment and plan:  Worsening headaches - Plan: MR Brain W Wo Contrast  Paresthesia - Plan: Vitamin B12, Folate, TSH  -we discussed possible serious  and likely etiologies, workup and treatment, treatment risks and return precautions - 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  .  -Patient advised to return or notify a doctor immediately if symptoms worsen or persist or new concerns arise.  Patient Instructions  BEFORE YOU LEAVE: -labs -schedule follow up in 1 month  Get MRI  -We have ordered labs or studies at this visit. It can take up to 1-2 weeks for  results and processing. We will contact you with instructions IF your results are abnormal. Normal results will be released to your [facility]. If you have not heard from [place] or can not find your results in [facility] in 2 weeks please contact our office.  -keep headache journal  -ok to use ibuprofen or excedrin up to 2-3 times per week for headaches if needed         Reshad Saab R.

## 2015-04-15 LAB — TSH: TSH: 1.74 u[IU]/mL (ref 0.35–4.50)

## 2015-04-15 LAB — VITAMIN B12: Vitamin B-12: 366 pg/mL (ref 211–911)

## 2015-04-15 LAB — FOLATE: Folate: 20.8 ng/mL (ref >5.9)

## 2015-04-16 NOTE — Progress Notes (Signed)
Reviewed personally.  M. Suzanne Harlie Buening, MD.  

## 2015-05-15 ENCOUNTER — Ambulatory Visit: Payer: BLUE CROSS/BLUE SHIELD | Admitting: Family Medicine

## 2015-05-28 ENCOUNTER — Telehealth: Payer: Self-pay | Admitting: Family Medicine

## 2015-05-28 NOTE — Telephone Encounter (Signed)
Patient called in and was upset about her bill for her 04/14/15 dos re: migraines. I explained in detail that Dr [name] does not chose the 'amount' that a patient is billed, it is dependant on the contractual rates of her insurance. This patient has a high deductible plan. I offered to have our coders review the charges and once they did review the claim the [place] stands. I called the patient and LM in detail that the bill was patient responsibility and why.

## 2016-04-13 ENCOUNTER — Ambulatory Visit: Payer: BLUE CROSS/BLUE SHIELD | Admitting: Certified Nurse Midwife

## 2016-04-20 ENCOUNTER — Encounter: Payer: Self-pay | Admitting: Certified Nurse Midwife

## 2016-04-20 ENCOUNTER — Ambulatory Visit (INDEPENDENT_AMBULATORY_CARE_PROVIDER_SITE_OTHER): Payer: BLUE CROSS/BLUE SHIELD | Admitting: Certified Nurse Midwife

## 2016-04-20 VITALS — BP 118/64 | HR 64 | Resp 16 | Ht 65.25 in | Wt 104.0 lb

## 2016-04-20 DIAGNOSIS — Z01419 Encounter for gynecological examination (general) (routine) without abnormal findings: Secondary | ICD-10-CM

## 2016-04-20 DIAGNOSIS — Z124 Encounter for screening for malignant neoplasm of cervix: Secondary | ICD-10-CM

## 2016-04-20 DIAGNOSIS — Z3041 Encounter for surveillance of contraceptive pills: Secondary | ICD-10-CM

## 2016-04-20 DIAGNOSIS — Z23 Encounter for immunization: Secondary | ICD-10-CM

## 2016-04-20 DIAGNOSIS — Z0000 Encounter for general adult medical examination without abnormal findings: Secondary | ICD-10-CM | POA: Diagnosis not present

## 2016-04-20 MED ORDER — ORTHO TRI-CYCLEN LO 0.18/0.215/0.25 MG-25 MCG PO TABS: 1.0000 | ORAL_TABLET | Freq: Every day | ORAL | 4 refills | Status: DC

## 2016-04-20 NOTE — Patient Instructions (Signed)
General topics  Next pap or exam is  due in 1 year Take a Women's multivitamin Take 1200 mg. of calcium daily - prefer dietary If any concerns in interim to call back  Breast Self-Awareness Practicing breast self-awareness may pick up problems early, prevent significant medical complications, and possibly save your life. By practicing breast self-awareness, you can become familiar with how your breasts look and feel and if your breasts are changing. This allows you to notice changes early. It can also offer you some reassurance that your breast health is good. One way to learn what is normal for your breasts and whether your breasts are changing is to do a breast self-exam. If you find a lump or something that was not present in the past, it is best to contact your caregiver right away. Other findings that should be evaluated by your caregiver include nipple discharge, especially if it is bloody; skin changes or reddening; areas where the skin seems to be pulled in (retracted); or new lumps and bumps. Breast pain is seldom associated with cancer (malignancy), but should also be evaluated by a caregiver. BREAST SELF-EXAM The best time to examine your breasts is 5 7 days after your menstrual period is over.  ExitCare Patient Information 2013 [place].   Exercise to Stay Healthy Exercise helps you become and stay healthy. EXERCISE IDEAS AND TIPS Choose exercises that:  You enjoy.  Fit into your day. You do not need to exercise really hard to be healthy. You can do exercises at a slow or medium level and stay healthy. You can:  Stretch before and after working out.  Try yoga, Pilates, or tai chi.  Lift weights.  Walk fast, swim, jog, run, climb stairs, bicycle, dance, or rollerskate.  Take aerobic classes. Exercises that burn about 150 calories:  Running 1  miles in 15 minutes.  Playing volleyball for 45 to 60 minutes.  Washing and waxing a car for 45 to 60  minutes.  Playing touch football for 45 minutes.  Walking 1  miles in 35 minutes.  Pushing a stroller 1  miles in 30 minutes.  Playing basketball for 30 minutes.  Raking leaves for 30 minutes.  Bicycling 5 miles in 30 minutes.  Walking 2 miles in 30 minutes.  Dancing for 30 minutes.  Shoveling snow for 15 minutes.  Swimming laps for 20 minutes.  Walking up stairs for 15 minutes.  Bicycling 4 miles in 15 minutes.  Gardening for 30 to 45 minutes.  Jumping rope for 15 minutes.  Washing windows or floors for 45 to 60 minutes. Document Released: 05/22/2010 Document Revised: 07/12/2011 Document Reviewed: 05/22/2010 [facility] Patient Information 2013 [place].   Other topics ( that may be useful information):    Sexually Transmitted Disease Sexually transmitted disease (STD) refers to any infection that is passed from person to person during sexual activity. This may happen by way of saliva, semen, blood, vaginal mucus, or urine. Common STDs include:  Gonorrhea.  Chlamydia.  Syphilis.  HIV/AIDS.  Genital herpes.  Hepatitis B and C.  Trichomonas.  Human papillomavirus (HPV).  Pubic lice. CAUSES  An STD may be spread by bacteria, virus, or parasite. A person can get an STD by:  Sexual intercourse with an infected person.  Sharing sex toys with an infected person.  Sharing needles with an infected person.  Having intimate contact with the genitals, mouth, or rectal areas of an infected person. SYMPTOMS  Some people may not have any symptoms, but  they can still pass the infection to others. Different STDs have different symptoms. Symptoms include:  Painful or bloody urination.  Pain in the pelvis, abdomen, vagina, anus, throat, or eyes.  Skin rash, itching, irritation, growths, or sores (lesions). These usually occur in the genital or anal area.  Abnormal vaginal discharge.  Penile discharge in men.  Soft, flesh-colored skin growths in the  genital or anal area.  Fever.  Pain or bleeding during sexual intercourse.  Swollen glands in the groin area.  Yellow skin and eyes (jaundice). This is seen with hepatitis. DIAGNOSIS  To make a diagnosis, your caregiver may:  Take a medical history.  Perform a physical exam.  Take a specimen (culture) to be examined.  Examine a sample of discharge under a microscope.  Perform blood test TREATMENT   Chlamydia, gonorrhea, trichomonas, and syphilis can be cured with antibiotic medicine.  Genital herpes, hepatitis, and HIV can be treated, but not cured, with prescribed medicines. The medicines will lessen the symptoms.  Genital warts from HPV can be treated with medicine or by freezing, burning (electrocautery), or surgery. Warts may come back.  HPV is a virus and cannot be cured with medicine or surgery.However, abnormal areas may be followed very closely by your caregiver and may be removed from the cervix, vagina, or vulva through office procedures or surgery. If your diagnosis is confirmed, your recent sexual partners need treatment. This is true even if they are symptom-free or have a negative culture or evaluation. They should not have sex until their caregiver says it is okay. HOME CARE INSTRUCTIONS  All sexual partners should be informed, tested, and treated for all STDs.  Take your antibiotics as directed. Finish them even if you start to feel better.  Only take over-the-counter or prescription medicines for pain, discomfort, or fever as directed by your caregiver.  Rest.  Eat a balanced diet and drink enough fluids to keep your urine clear or pale yellow.  Do not have sex until treatment is completed and you have followed up with your caregiver. STDs should be checked after treatment.  Keep all follow-up appointments, Pap tests, and blood tests as directed by your caregiver.  Only use latex condoms and water-soluble lubricants during sexual activity. Do not use  petroleum jelly or oils.  Avoid alcohol and illegal drugs.  Get vaccinated for HPV and hepatitis. If you have not received these vaccines in the past, talk to your caregiver about whether one or both might be right for you.  Avoid risky sex practices that can break the skin. The only way to avoid getting an STD is to avoid all sexual activity.Latex condoms and dental dams (for oral sex) will help lessen the risk of getting an STD, but will not completely eliminate the risk. SEEK MEDICAL CARE IF:   You have a fever.  You have any new or worsening symptoms. Document Released: 07/10/2002 Document Revised: 07/12/2011 Document Reviewed: 07/17/2010 [facility] Patient Information 2013 [place].    Domestic Abuse You are being battered or abused if someone close to you hits, pushes, or physically hurts you in any way. You also are being abused if you are forced into activities. You are being sexually abused if you are forced to have sexual contact of any kind. You are being emotionally abused if you are made to feel worthless or if you are constantly threatened. It is important to remember that help is available. No one has the right to abuse you. PREVENTION OF FURTHER  ABUSE  Learn the warning signs of danger. This varies with situations but may include: the use of alcohol, threats, isolation from friends and family, or forced sexual contact. Leave if you feel that violence is going to occur.  If you are attacked or beaten, report it to the police so the abuse is documented. You do not have to press charges. The police can protect you while you or the attackers are leaving. Get the officer's name and badge number and a copy of the report.  Find someone you can trust and tell them what is happening to you: your caregiver, a nurse, clergy member, close friend or family member. Feeling ashamed is natural, but remember that you have done nothing wrong. No one deserves abuse. Document Released:  04/16/2000 Document Revised: 07/12/2011 Document Reviewed: 06/25/2010 ExitCare Patient Information 2013 [employer].    How Much is Too Much Alcohol? Drinking too much alcohol can cause injury, accidents, and health problems. These types of problems can include:   Car crashes.  Falls.  Family fighting (domestic violence).  Drowning.  Fights.  Injuries.  Burns.  Damage to certain organs.  Having a baby with birth defects. ONE DRINK CAN BE TOO MUCH WHEN YOU ARE:  Working.  Pregnant or breastfeeding.  Taking medicines. Ask your doctor.  Driving or planning to drive. If you or someone you know has a drinking problem, get help from a doctor.  Document Released: 02/13/2009 Document Revised: 07/12/2011 Document Reviewed: 02/13/2009 ExitCare Patient Information 2013 Exit[employer].   Smoking Hazards Smoking cigarettes is extremely bad for your health. Tobacco smoke has over 200 known poisons in it. There are over 60 chemicals in tobacco smoke that cause cancer. Some of the chemicals found in cigarette smoke include:   Cyanide.  Benzene.  Formaldehyde.  Methanol (wood alcohol).  Acetylene (fuel used in welding torches).  Ammonia. Cigarette smoke also contains the poisonous gases nitrogen oxide and carbon monoxide.  Cigarette smokers have an increased risk of many serious medical problems and Smoking causes approximately:  90% of all lung cancer deaths in men.  80% of all lung cancer deaths in women.  90% of deaths from chronic obstructive lung disease. Compared with nonsmokers, smoking increases the risk of:  Coronary heart disease by 2 to 4 times.  Stroke by 2 to 4 times.  Men developing lung cancer by 23 times.  Women developing lung cancer by 13 times.  Dying from chronic obstructive lung diseases by 12 times.  . Smoking is the most preventable cause of death and disease in our society.  WHY IS SMOKING ADDICTIVE?  Nicotine is the chemical  agent in tobacco that is capable of causing addiction or dependence.  When you smoke and inhale, nicotine is absorbed rapidly into the bloodstream through your lungs. Nicotine absorbed through the lungs is capable of creating a powerful addiction. Both inhaled and non-inhaled nicotine may be addictive.  Addiction studies of cigarettes and spit tobacco show that addiction to nicotine occurs mainly during the teen years, when young people begin using tobacco products. WHAT ARE THE BENEFITS OF QUITTING?  There are many health benefits to quitting smoking.   Likelihood of developing cancer and heart disease decreases. Health improvements are seen almost immediately.  Blood pressure, pulse rate, and breathing patterns start returning to normal soon after quitting. QUITTING SMOKING   American Lung Association - 1-800-LUNGUSA  American Cancer Society - 1-800-ACS-2345 Document Released: 05/27/2004 Document Revised: 07/12/2011 Document Reviewed: 01/29/2009 ExitCare Patient Information 2013 ExitCare,   LLC.   Stress Management Stress is a state of physical or mental tension that often results from changes in your life or normal routine. Some common causes of stress are:  Death of a loved one.  Injuries or severe illnesses.  Getting fired or changing jobs.  Moving into a new home. Other causes may be:  Sexual problems.  Business or financial losses.  Taking on a large debt.  Regular conflict with someone at home or at work.  Constant tiredness from lack of sleep. It is not just bad things that are stressful. It may be stressful to:  Win the lottery.  Get married.  Buy a new car. The amount of stress that can be easily tolerated varies from person to person. Changes generally cause stress, regardless of the types of change. Too much stress can affect your health. It may lead to physical or emotional problems. Too little stress (boredom) may also become stressful. SUGGESTIONS TO  REDUCE STRESS:  Talk things over with your family and friends. It often is helpful to share your concerns and worries. If you feel your problem is serious, you may want to get help from a professional counselor.  Consider your problems one at a time instead of lumping them all together. Trying to take care of everything at once may seem impossible. List all the things you need to do and then start with the most important one. Set a goal to accomplish 2 or 3 things each day. If you expect to do too many in a single day you will naturally fail, causing you to feel even more stressed.  Do not use alcohol or drugs to relieve stress. Although you may feel better for a short time, they do not remove the problems that caused the stress. They can also be habit forming.  Exercise regularly - at least 3 times per week. Physical exercise can help to relieve that "uptight" feeling and will relax you.  The shortest distance between despair and hope is often a good night's sleep.  Go to bed and get up on time allowing yourself time for appointments without being rushed.  Take a short "time-out" period from any stressful situation that occurs during the day. Close your eyes and take some deep breaths. Starting with the muscles in your face, tense them, hold it for a few seconds, then relax. Repeat this with the muscles in your neck, shoulders, hand, stomach, back and legs.  Take good care of yourself. Eat a balanced diet and get plenty of rest.  Schedule time for having fun. Take a break from your daily routine to relax. HOME CARE INSTRUCTIONS   Call if you feel overwhelmed by your problems and feel you can no longer manage them on your own.  Return immediately if you feel like hurting yourself or someone else. Document Released: 10/13/2000 Document Revised: 07/12/2011 Document Reviewed: 06/05/2007 [facility] Patient Information 2013 [place].

## 2016-04-20 NOTE — Progress Notes (Signed)
25 y.o. G0P0000 Single  Caucasian Fe here for annual exam. Periods normal, no issues. Contraception working well. No health issues in past year. Getting married in 7/18. Would like to up date TDAP today for travel next year.   Patient's last menstrual period was 03/28/2016 (exact date).          Sexually active: Yes.    The current method of family planning is OCP (estrogen/progesterone).    Exercising: Yes.    run, ride horses Smoker:  no  Health Maintenance: Pap:  04-06-13 neg MMG:  none Colonoscopy:  none BMD:   none TDaP:  2009 ? Shingles: no Pneumonia: no Hep C and HIV: HIV neg 2014 Labs: hgb-13.2 Self breast exam: done occ   reports that she has never smoked. She has never used smokeless tobacco. She reports that she drinks about 1.2 - 1.8 oz of alcohol per week . She reports that she does not use drugs.  Past Medical History:  Diagnosis Date  . Acne - sees dermatologist 11/08/2013  . Frequent headaches     Past Surgical History:  Procedure Laterality Date  . BUNIONECTOMY Right 10/14  . REFRACTIVE SURGERY      Current Outpatient Prescriptions  Medication Sig Dispense Refill  . Carboxymethylcell-Hypromellose (GENTEAL OP) Apply to eye.    Manson Passey TRI-CYCLEN LO 0.18/0.215/0.25 MG-25 MCG tab Take 1 tablet by mouth daily. 1 Package 12   No current facility-administered medications for this visit.     Family History  Problem Relation Age of Onset  . Thyroid disease Mother   . Osteoporosis Maternal Grandmother   . Cancer Maternal Grandfather     prostate  . Stroke Maternal Grandfather     ROS:  Pertinent items are noted in HPI.  Otherwise, a comprehensive ROS was negative.  Exam:   BP 118/64   Pulse 64   Resp 16   Ht 5' 5.25" (1.657 m)   Wt 104 lb (47.2 kg)   LMP 03/28/2016 (Exact Date)   BMI 17.17 kg/m  Height: 5' 5.25" (165.7 cm) Ht Readings from Last 3 Encounters:  04/20/16 5' 5.25" (1.657 m)  04/14/15 5' 5.5" (1.664 m)  04/10/15 5' 5.5" (1.664 m)     General appearance: alert, cooperative and appears stated age Head: Normocephalic, without obvious abnormality, atraumatic Neck: no adenopathy, supple, symmetrical, trachea midline and thyroid normal to inspection and palpation Lungs: clear to auscultation bilaterally Breasts: normal appearance, no masses or tenderness, No nipple retraction or dimpling, No nipple discharge or bleeding, No axillary or supraclavicular adenopathy Heart: regular rate and rhythm Abdomen: soft, non-tender; no masses,  no organomegaly Extremities: extremities normal, atraumatic, no cyanosis or edema Skin: Skin color, texture, turgor normal. No rashes or lesions Lymph nodes: Cervical, supraclavicular, and axillary nodes normal. No abnormal inguinal nodes palpated Neurologic: Grossly normal   Pelvic: External genitalia:  no lesions              Urethra:  normal appearing urethra with no masses, tenderness or lesions              Bartholin's and Skene's: normal                 Vagina: normal appearing vagina with normal color and discharge, no lesions              Cervix: no bleeding following Pap, no lesions and nulliparous appearance              Pap taken: Yes.  Bimanual Exam:  Uterus:  normal size, contour, position, consistency, mobility, non-tender and anteverted              Adnexa: normal adnexa and no mass, fullness, tenderness               Rectovaginal: Confirms               Anus:  normal sphincter tone, no lesions  Chaperone present: yes  A:  Well Woman with normal exam  Contraception OCP desired  Immunization Update  P:   Reviewed health and wellness pertinent to exam  Rx Ortho tricyclen lo see order with instrucitons  Requests TDAP  Pap smear as above with HPV reflex   counseled on breast self exam, use and side effects of OCP's, adequate intake of calcium and vitamin D, diet and exercise  return annually or prn  An After Visit Summary was printed and given to the patient.  tdap  given in L Deltoid today. -sco

## 2016-04-21 LAB — IPS PAP TEST WITH REFLEX TO HPV

## 2016-04-23 LAB — HEMOGLOBIN, FINGERSTICK: Hemoglobin, fingerstick: 13.2 g/dL (ref 12.0–15.0)

## 2016-04-23 NOTE — Progress Notes (Signed)
Encounter reviewed Anissa Abbs, MD   

## 2016-06-30 ENCOUNTER — Other Ambulatory Visit: Payer: Self-pay | Admitting: Certified Nurse Midwife

## 2016-06-30 DIAGNOSIS — Z3041 Encounter for surveillance of contraceptive pills: Secondary | ICD-10-CM

## 2016-06-30 NOTE — Telephone Encounter (Signed)
Medication refill request: ORTHO TRI-CYCLEN LO Last AEX:  04/20/16 DL Next AEX: 04/21/17 Last MMG (if hormonal medication request): n/a Refill authorized: 04/20/16 #3packs w/4 refills; today refused, patient has enough refills

## 2016-07-17 ENCOUNTER — Other Ambulatory Visit: Payer: Self-pay | Admitting: Certified Nurse Midwife

## 2016-07-17 DIAGNOSIS — Z3041 Encounter for surveillance of contraceptive pills: Secondary | ICD-10-CM

## 2016-07-19 NOTE — Telephone Encounter (Signed)
Medication refill request: ORTHO TRI-CYCLEN LO Last AEX:  04/20/16 DL Next AEX: 04/21/17 Last MMG (if hormonal medication request): n/a Refill authorized: 04/20/16 #3packs w/4 refills

## 2016-07-19 NOTE — Telephone Encounter (Signed)
Medication refill request: ortho tri-cyclen  Last AEX:  04/20/16 DL Next AEX: 04/21/17 DL Last MMG (if hormonal medication request): none Refill authorized: 04/20/16 #3/4R. To [place]

## 2017-01-20 ENCOUNTER — Encounter: Payer: Self-pay | Admitting: Family Medicine

## 2017-04-21 ENCOUNTER — Ambulatory Visit (INDEPENDENT_AMBULATORY_CARE_PROVIDER_SITE_OTHER): Payer: BLUE CROSS/BLUE SHIELD | Admitting: Certified Nurse Midwife

## 2017-04-21 ENCOUNTER — Encounter: Payer: Self-pay | Admitting: Certified Nurse Midwife

## 2017-04-21 ENCOUNTER — Ambulatory Visit: Payer: BLUE CROSS/BLUE SHIELD | Admitting: Certified Nurse Midwife

## 2017-04-21 ENCOUNTER — Other Ambulatory Visit: Payer: Self-pay

## 2017-04-21 VITALS — BP 90/60 | HR 68 | Resp 16 | Ht 65.25 in | Wt 106.0 lb

## 2017-04-21 DIAGNOSIS — Z01419 Encounter for gynecological examination (general) (routine) without abnormal findings: Secondary | ICD-10-CM | POA: Diagnosis not present

## 2017-04-21 DIAGNOSIS — Z789 Other specified health status: Secondary | ICD-10-CM | POA: Diagnosis not present

## 2017-04-21 NOTE — Patient Instructions (Signed)
General topics  Next pap or exam is  due in 1 year Take a Women's multivitamin Take 1200 mg. of calcium daily - prefer dietary If any concerns in interim to call back  Breast Self-Awareness Practicing breast self-awareness may pick up problems early, prevent significant medical complications, and possibly save your life. By practicing breast self-awareness, you can become familiar with how your breasts look and feel and if your breasts are changing. This allows you to notice changes early. It can also offer you some reassurance that your breast health is good. One way to learn what is normal for your breasts and whether your breasts are changing is to do a breast self-exam. If you find a lump or something that was not present in the past, it is best to contact your caregiver right away. Other findings that should be evaluated by your caregiver include nipple discharge, especially if it is bloody; skin changes or reddening; areas where the skin seems to be pulled in (retracted); or new lumps and bumps. Breast pain is seldom associated with cancer (malignancy), but should also be evaluated by a caregiver. BREAST SELF-EXAM The best time to examine your breasts is 5 7 days after your menstrual period is over.  ExitCare Patient Information 2013 ExitCare, LLC.   Exercise to Stay Healthy Exercise helps you become and stay healthy. EXERCISE IDEAS AND TIPS Choose exercises that:  You enjoy.  Fit into your day. You do not need to exercise really hard to be healthy. You can do exercises at a slow or medium level and stay healthy. You can:  Stretch before and after working out.  Try yoga, Pilates, or tai chi.  Lift weights.  Walk fast, swim, jog, run, climb stairs, bicycle, dance, or rollerskate.  Take aerobic classes. Exercises that burn about 150 calories:  Running 1  miles in 15 minutes.  Playing volleyball for 45 to 60 minutes.  Washing and waxing a car for 45 to 60  minutes.  Playing touch football for 45 minutes.  Walking 1  miles in 35 minutes.  Pushing a stroller 1  miles in 30 minutes.  Playing basketball for 30 minutes.  Raking leaves for 30 minutes.  Bicycling 5 miles in 30 minutes.  Walking 2 miles in 30 minutes.  Dancing for 30 minutes.  Shoveling snow for 15 minutes.  Swimming laps for 20 minutes.  Walking up stairs for 15 minutes.  Bicycling 4 miles in 15 minutes.  Gardening for 30 to 45 minutes.  Jumping rope for 15 minutes.  Washing windows or floors for 45 to 60 minutes. Document Released: 05/22/2010 Document Revised: 07/12/2011 Document Reviewed: 05/22/2010 ExitCare Patient Information 2013 ExitCare, LLC.   Other topics ( that may be useful information):    Sexually Transmitted Disease Sexually transmitted disease (STD) refers to any infection that is passed from person to person during sexual activity. This may happen by way of saliva, semen, blood, vaginal mucus, or urine. Common STDs include:  Gonorrhea.  Chlamydia.  Syphilis.  HIV/AIDS.  Genital herpes.  Hepatitis B and C.  Trichomonas.  Human papillomavirus (HPV).  Pubic lice. CAUSES  An STD may be spread by bacteria, virus, or parasite. A person can get an STD by:  Sexual intercourse with an infected person.  Sharing sex toys with an infected person.  Sharing needles with an infected person.  Having intimate contact with the genitals, mouth, or rectal areas of an infected person. SYMPTOMS  Some people may not have any symptoms, but   they can still pass the infection to others. Different STDs have different symptoms. Symptoms include:  Painful or bloody urination.  Pain in the pelvis, abdomen, vagina, anus, throat, or eyes.  Skin rash, itching, irritation, growths, or sores (lesions). These usually occur in the genital or anal area.  Abnormal vaginal discharge.  Penile discharge in men.  Soft, flesh-colored skin growths in the  genital or anal area.  Fever.  Pain or bleeding during sexual intercourse.  Swollen glands in the groin area.  Yellow skin and eyes (jaundice). This is seen with hepatitis. DIAGNOSIS  To make a diagnosis, your caregiver may:  Take a medical history.  Perform a physical exam.  Take a specimen (culture) to be examined.  Examine a sample of discharge under a microscope.  Perform blood test TREATMENT   Chlamydia, gonorrhea, trichomonas, and syphilis can be cured with antibiotic medicine.  Genital herpes, hepatitis, and HIV can be treated, but not cured, with prescribed medicines. The medicines will lessen the symptoms.  Genital warts from HPV can be treated with medicine or by freezing, burning (electrocautery), or surgery. Warts may come back.  HPV is a virus and cannot be cured with medicine or surgery.However, abnormal areas may be followed very closely by your caregiver and may be removed from the cervix, vagina, or vulva through office procedures or surgery. If your diagnosis is confirmed, your recent sexual partners need treatment. This is true even if they are symptom-free or have a negative culture or evaluation. They should not have sex until their caregiver says it is okay. HOME CARE INSTRUCTIONS  All sexual partners should be informed, tested, and treated for all STDs.  Take your antibiotics as directed. Finish them even if you start to feel better.  Only take over-the-counter or prescription medicines for pain, discomfort, or fever as directed by your caregiver.  Rest.  Eat a balanced diet and drink enough fluids to keep your urine clear or pale yellow.  Do not have sex until treatment is completed and you have followed up with your caregiver. STDs should be checked after treatment.  Keep all follow-up appointments, Pap tests, and blood tests as directed by your caregiver.  Only use latex condoms and water-soluble lubricants during sexual activity. Do not use  petroleum jelly or oils.  Avoid alcohol and illegal drugs.  Get vaccinated for HPV and hepatitis. If you have not received these vaccines in the past, talk to your caregiver about whether one or both might be right for you.  Avoid risky sex practices that can break the skin. The only way to avoid getting an STD is to avoid all sexual activity.Latex condoms and dental dams (for oral sex) will help lessen the risk of getting an STD, but will not completely eliminate the risk. SEEK MEDICAL CARE IF:   You have a fever.  You have any new or worsening symptoms. Document Released: 07/10/2002 Document Revised: 07/12/2011 Document Reviewed: 07/17/2010 [facility] Patient Information 2013 [place].    Domestic Abuse You are being battered or abused if someone close to you hits, pushes, or physically hurts you in any way. You also are being abused if you are forced into activities. You are being sexually abused if you are forced to have sexual contact of any kind. You are being emotionally abused if you are made to feel worthless or if you are constantly threatened. It is important to remember that help is available. No one has the right to abuse you. PREVENTION OF FURTHER  ABUSE  Learn the warning signs of danger. This varies with situations but may include: the use of alcohol, threats, isolation from friends and family, or forced sexual contact. Leave if you feel that violence is going to occur.  If you are attacked or beaten, report it to the police so the abuse is documented. You do not have to press charges. The police can protect you while you or the attackers are leaving. Get the officer's name and badge number and a copy of the report.  Find someone you can trust and tell them what is happening to you: your caregiver, a nurse, clergy member, close friend or family member. Feeling ashamed is natural, but remember that you have done nothing wrong. No one deserves abuse. Document Released:  04/16/2000 Document Revised: 07/12/2011 Document Reviewed: 06/25/2010 ExitCare Patient Information 2013 ExitCare, LLC.    How Much is Too Much Alcohol? Drinking too much alcohol can cause injury, accidents, and health problems. These types of problems can include:   Car crashes.  Falls.  Family fighting (domestic violence).  Drowning.  Fights.  Injuries.  Burns.  Damage to certain organs.  Having a baby with birth defects. ONE DRINK CAN BE TOO MUCH WHEN YOU ARE:  Working.  Pregnant or breastfeeding.  Taking medicines. Ask your doctor.  Driving or planning to drive. If you or someone you know has a drinking problem, get help from a doctor.  Document Released: 02/13/2009 Document Revised: 07/12/2011 Document Reviewed: 02/13/2009 ExitCare Patient Information 2013 ExitCare, LLC.   Smoking Hazards Smoking cigarettes is extremely bad for your health. Tobacco smoke has over 200 known poisons in it. There are over 60 chemicals in tobacco smoke that cause cancer. Some of the chemicals found in cigarette smoke include:   Cyanide.  Benzene.  Formaldehyde.  Methanol (wood alcohol).  Acetylene (fuel used in welding torches).  Ammonia. Cigarette smoke also contains the poisonous gases nitrogen oxide and carbon monoxide.  Cigarette smokers have an increased risk of many serious medical problems and Smoking causes approximately:  90% of all lung cancer deaths in men.  80% of all lung cancer deaths in women.  90% of deaths from chronic obstructive lung disease. Compared with nonsmokers, smoking increases the risk of:  Coronary heart disease by 2 to 4 times.  Stroke by 2 to 4 times.  Men developing lung cancer by 23 times.  Women developing lung cancer by 13 times.  Dying from chronic obstructive lung diseases by 12 times.  . Smoking is the most preventable cause of death and disease in our society.  WHY IS SMOKING ADDICTIVE?  Nicotine is the chemical  agent in tobacco that is capable of causing addiction or dependence.  When you smoke and inhale, nicotine is absorbed rapidly into the bloodstream through your lungs. Nicotine absorbed through the lungs is capable of creating a powerful addiction. Both inhaled and non-inhaled nicotine may be addictive.  Addiction studies of cigarettes and spit tobacco show that addiction to nicotine occurs mainly during the teen years, when young people begin using tobacco products. WHAT ARE THE BENEFITS OF QUITTING?  There are many health benefits to quitting smoking.   Likelihood of developing cancer and heart disease decreases. Health improvements are seen almost immediately.  Blood pressure, pulse rate, and breathing patterns start returning to normal soon after quitting. QUITTING SMOKING   American Lung Association - 1-800-LUNGUSA  American Cancer Society - 1-800-ACS-2345 Document Released: 05/27/2004 Document Revised: 07/12/2011 Document Reviewed: 01/29/2009 ExitCare Patient Information 2013 ExitCare,   LLC.   Stress Management Stress is a state of physical or mental tension that often results from changes in your life or normal routine. Some common causes of stress are:  Death of a loved one.  Injuries or severe illnesses.  Getting fired or changing jobs.  Moving into a new home. Other causes may be:  Sexual problems.  Business or financial losses.  Taking on a large debt.  Regular conflict with someone at home or at work.  Constant tiredness from lack of sleep. It is not just bad things that are stressful. It may be stressful to:  Win the lottery.  Get married.  Buy a new car. The amount of stress that can be easily tolerated varies from person to person. Changes generally cause stress, regardless of the types of change. Too much stress can affect your health. It may lead to physical or emotional problems. Too little stress (boredom) may also become stressful. SUGGESTIONS TO  REDUCE STRESS:  Talk things over with your family and friends. It often is helpful to share your concerns and worries. If you feel your problem is serious, you may want to get help from a professional counselor.  Consider your problems one at a time instead of lumping them all together. Trying to take care of everything at once may seem impossible. List all the things you need to do and then start with the most important one. Set a goal to accomplish 2 or 3 things each day. If you expect to do too many in a single day you will naturally fail, causing you to feel even more stressed.  Do not use alcohol or drugs to relieve stress. Although you may feel better for a short time, they do not remove the problems that caused the stress. They can also be habit forming.  Exercise regularly - at least 3 times per week. Physical exercise can help to relieve that "uptight" feeling and will relax you.  The shortest distance between despair and hope is often a good night's sleep.  Go to bed and get up on time allowing yourself time for appointments without being rushed.  Take a short "time-out" period from any stressful situation that occurs during the day. Close your eyes and take some deep breaths. Starting with the muscles in your face, tense them, hold it for a few seconds, then relax. Repeat this with the muscles in your neck, shoulders, hand, stomach, back and legs.  Take good care of yourself. Eat a balanced diet and get plenty of rest.  Schedule time for having fun. Take a break from your daily routine to relax. HOME CARE INSTRUCTIONS   Call if you feel overwhelmed by your problems and feel you can no longer manage them on your own.  Return immediately if you feel like hurting yourself or someone else. Document Released: 10/13/2000 Document Revised: 07/12/2011 Document Reviewed: 06/05/2007 ExitCare Patient Information 2013 ExitCare, LLC.   

## 2017-04-21 NOTE — Progress Notes (Signed)
26 y.o. G0P0000 married Caucasian Fe here for annual exam. Stopped OCP 3-4 months ago and now cycles are every 30 days. Condom use now for contraception. May try for pregnancy in the spring. Tracking cycles now to determine if normal. Sees Urgent care if needed. 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 Eating well, no weight change. No other health issues today.   Patient's last menstrual period was 04/13/2017 (exact date).          Sexually active: Yes.    The current method of family planning is condoms all the time.    Exercising: Yes.    cardio Smoker:  no  Health Maintenance: Pap:  04-06-13 neg, 04-20-16 neg History of Abnormal Pap: no MMG:  none Self Breast exams: no Colonoscopy:  none BMD:   none TDaP:  2017 Shingles: no Pneumonia: no Hep C and HIV: HIV neg 2014 Labs: if needed   reports that  has never smoked. she has never used smokeless tobacco. She reports that she drinks about 1.2 - 1.8 oz of alcohol per week. She reports that she does not use drugs.  Past Medical History:  Diagnosis Date  . Acne - sees dermatologist 11/08/2013  . Frequent headaches     Past Surgical History:  Procedure Laterality Date  . BUNIONECTOMY Right 10/14  . REFRACTIVE SURGERY      No current outpatient medications on file.   No current facility-administered medications for this visit.     Family History  Problem Relation Age of Onset  . Thyroid disease Mother   . Osteoporosis Maternal Grandmother   . Cancer Maternal Grandfather        prostate  . Stroke Maternal Grandfather     ROS:  Pertinent items are noted in HPI.  Otherwise, a comprehensive ROS was negative.  Exam:   BP 90/60   Pulse 68   Resp 16   Ht 5' 5.25" (1.657 m)   Wt 106 lb (48.1 kg)   LMP 04/13/2017 (Exact Date)   BMI 17.50 kg/m  Height: 5' 5.25" (165.7 cm) Ht Readings from Last 3 Encounters:  04/21/17 5' 5.25" (1.657 m)  04/20/16 5' 5.25" (1.657 m)  04/14/15 5' 5.5" (1.664 m)     General appearance: alert, cooperative and appears stated age Head: Normocephalic, without obvious abnormality, atraumatic Neck: no adenopathy, supple, symmetrical, trachea midline and thyroid normal to inspection and palpation Lungs: clear to auscultation bilaterally Breasts: normal appearance, no masses or tenderness, No nipple retraction or dimpling, No nipple discharge or bleeding, No axillary or supraclavicular adenopathy Heart: regular rate and rhythm Abdomen: soft, non-tender; no masses,  no organomegaly Extremities: extremities normal, atraumatic, no cyanosis or edema Skin: Skin color, texture, turgor normal. No rashes or lesions Lymph nodes: Cervical, supraclavicular, and axillary nodes normal. No abnormal inguinal nodes palpated Neurologic: Grossly normal   Pelvic: External genitalia:  no lesions              Urethra:  normal appearing urethra with no masses, tenderness or lesions              Bartholin's and Skene's: normal                 Vagina: normal appearing vagina with normal color and discharge, no lesions              Cervix: no cervical motion tenderness, no lesions and nulliparous appearance  Pap taken: No. Bimanual Exam:  Uterus:  normal size, contour, position, consistency, mobility, non-tender and anteverted              Adnexa: normal adnexa and no mass, fullness, tenderness               Rectovaginal: Confirms               Anus:  normal sphincter tone, no lesions  Chaperone present: yes  A:  Well Woman with normal exam  Contraception condoms  May try for pregnancy in spring  Fatigue job related  Rubella immune status unknown  P:   Reviewed health and wellness pertinent to exam  Discussed starting on Prenatal vitamins for preparation for conception in the spring and will help with fatigue level. Suggested preconceptual consult 2-3 months prior to trying if she desires. Encouraged to read printed information given for good health prior to  conceiving. Questions addressed.  Discussed eating good protein intake to help with fatigue and work on stress reduction methods for her.  Discussed importance of Rubella immunity with pregnancy. Will draw today.  Lab; Rubella  Pap smear: no   counseled on breast self exam, family planning choices, adequate intake of calcium and vitamin D  return annually or prn  An After Visit Summary was printed and given to the patient.

## 2017-04-22 LAB — RUBELLA SCREEN: RUBELLA: 1.92 {index} (ref >0.99)

## 2017-05-12 ENCOUNTER — Encounter: Payer: Self-pay | Admitting: Family Medicine

## 2017-05-31 ENCOUNTER — Telehealth: Payer: Self-pay | Admitting: Certified Nurse Midwife

## 2017-05-31 NOTE — Telephone Encounter (Signed)
Patient canceled her preconception counseling appointment 06/03/17. Patient will call later to reschedule.

## 2017-06-03 ENCOUNTER — Ambulatory Visit: Payer: BLUE CROSS/BLUE SHIELD | Admitting: Certified Nurse Midwife

## 2017-09-02 ENCOUNTER — Telehealth: Payer: Self-pay | Admitting: Certified Nurse Midwife

## 2017-09-02 NOTE — Telephone Encounter (Signed)
Spoke with patient. Patient was diagnosed with the Norovirus yesterday. Onset of symptoms Wednesday evening. Had vomiting, diarrhea, and fever yesterday. Patient's LMP was 08/02/2017. Took UPT this morning that was positive. Patient is worried about having the Norovirus with positive UPT. Is taking Promethazine 12.5 mg and Loperamide. Denies any nausea, vomiting, diarrhea or fever today. Requesting to come in for pregnancy confirmation today. Advised due to risk for contagion would be best to take time for her to get better and then come in for confirmation. Advised will need to ensure she is drinking plenty of fluids to avoid dehydration. Advised will review with Melvia Heaps CNM and return call.

## 2017-09-02 NOTE — Telephone Encounter (Signed)
Spoke with patient. Advised reviewed with Grace Zhang CNM and patient will need to ensure she is staying very hydrating and resting well. Contagious period is 5-7 days. Advised most important is ensuring she remain hydrated with water, Pedialyte, and ginger ale. May eat small meals. Should not over eat. Take limited medication. Patient is very concerned she may need IV fluids and the risk for miscarriage with Norovirus. Advised patient may be seen [facility] or ER if she feels she may need IV fluids as we do not have this available in the office. They will be able to assess and treat her. Patient will consider this. Confirmation appointment scheduled for 09/06/2017 at 1:45 pm with Grace Zhang CNM. Patient will call Monday to provide update on how she is feeling.  Routing to [employer] CNM for final review.

## 2017-09-02 NOTE — Telephone Encounter (Signed)
Patient calling to speak with nurse about an appointment today.

## 2017-09-02 NOTE — Telephone Encounter (Signed)
Patient says she is pregnant and was diagnosed with the norovirus. Would like to speak with nurse.

## 2017-09-02 NOTE — Telephone Encounter (Signed)
Agree with plan 

## 2017-09-05 ENCOUNTER — Telehealth: Payer: Self-pay | Admitting: Certified Nurse Midwife

## 2017-09-05 NOTE — Telephone Encounter (Signed)
Patient calling with update on Norovirus. She states she hasn't had any diarrhea since late last Thursday. She has kept well hydrated over the weekend and would like to keep appointment tomorrow for pregnancy confirmation. Her LMP 08-02-17 and had positive home UPT. Advised patient we usually state as long as no symptoms for 3 days okay for visit in office. She states she's just extremely tired she did go to work today. She was dx'd via "phone visit" with doctor. Patient concerned about pregnancy now due to her illness. Advised probably okay to keep visit tomorrow.

## 2017-09-05 NOTE — Telephone Encounter (Signed)
Patient called to confirm that she can still be seen tomorrow. Patient had neuro virus on Friday.

## 2017-09-06 ENCOUNTER — Ambulatory Visit (INDEPENDENT_AMBULATORY_CARE_PROVIDER_SITE_OTHER): Payer: BLUE CROSS/BLUE SHIELD | Admitting: Certified Nurse Midwife

## 2017-09-06 ENCOUNTER — Encounter: Payer: Self-pay | Admitting: Certified Nurse Midwife

## 2017-09-06 ENCOUNTER — Other Ambulatory Visit: Payer: Self-pay

## 2017-09-06 VITALS — BP 88/60 | HR 64 | Temp 97.4°F | Resp 16 | Ht 65.25 in | Wt 105.0 lb

## 2017-09-06 DIAGNOSIS — N912 Amenorrhea, unspecified: Secondary | ICD-10-CM | POA: Diagnosis not present

## 2017-09-06 DIAGNOSIS — Z3201 Encounter for pregnancy test, result positive: Secondary | ICD-10-CM

## 2017-09-06 LAB — POCT URINE PREGNANCY: Preg Test, Ur: POSITIVE — AB

## 2017-09-06 NOTE — Progress Notes (Signed)
27 y.o. married white female g0p0 presents with amenorrhea with + UPT on 09/02/17. LMP 08/02/17. Planned pregnancy. Complaining of breast tenderness, fatigue.. Denies spotting, bleeding or cramping. Had Noro virus? With diarrhea and Vomiting for 24-48 hours, 3 days ago Medications she was given per Tele doc were Promethazine 12.5 mg, Loperamide 4 doses of both She completed these and has been working on getting back to normal eating pattern now. Has tried to increase water slowly. . Patient consumed Pedialyte also and now eating light foods and hydrating as well. Has been cutting back activity for the past few days to allow for  gaining back her energy. 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 She has decided to forgo what she can at this point. Patient has not consumed alcohol since + UPT or other medications other than prenatal vitamins.Marland Kitchen Spouse supportive, but travels a lot also. Has numerous trips planned for the next few months, so has concerns with pregnancy and flying. Excited to be pregnant, but ready to feel well. No other health issues today.  ROS Pertinent to HPI  O: HPI pertinent to above. Healthy WDWN female Affect: normal, orientation x 3  Last Aex:04/21/17 Pap smear: negative           Rubella screen: immune  A: Amenorrhea with positive UPT  5 wk 0 days per LN[facility] with [facility] 05/09/18 Planned  Pregnancy Recent ? Noro virus now recovering Appetite recovering   P: Reviewed with patient importance of prenatal care during pregnancy. Given OB provider list. Reviewed nutrition importance of pregnancy and selecting from all food groups and making sure to have adequate protein and variety of intake daily. Discussed avoiding raw or exotic fish, soft cheeses due to risk of bacteria . Discussed concerns with FAS with alcohol use in pregnancy. Discussed increase of IUGR and SIDS with  second smoke. Reviewed warning signs of early pregnancy and need to  advise if occurs.Advise if bleeding or cramping with bleeding. Be sure to hydrate well and decrease activity as needed.  Discussed comfort measures for early pregnancy changes. Offered viability PUS here prior to initiating prenatal care. Patient would like to have PUS prior to starting OB care She will be called with insurance information and scheduled. PUS will be reviewed with patient at that time. Questions addressed at length.  Labs: none  Rv prn  Time spent with patient in face to face counseling was 40 minutes regarding pregnancy and prenatal care

## 2017-09-06 NOTE — Patient Instructions (Signed)

## 2017-09-06 NOTE — Telephone Encounter (Signed)
agree

## 2017-09-07 ENCOUNTER — Telehealth: Payer: Self-pay | Admitting: Certified Nurse Midwife

## 2017-09-07 NOTE — Telephone Encounter (Signed)
6- 7 weeks is appropriate, she is worried due to the Viral illness she contracted recently.

## 2017-09-07 NOTE — Telephone Encounter (Signed)
Spoke with patient regarding benefits for recommended ultrasound.Patient understood and agreeable. Patient is scheduled 09/22/17 with Dr Sabra Heck. Reviewed scheduling options with [facility]. Patient wants to verify the timeframe at seven weeks is appropriate and not at six weeks. Routing to provider to review  Routing to [employer]. CNM  cc: Reesa Chew, RN

## 2017-09-07 NOTE — Telephone Encounter (Signed)
Patient called wanting to reschedule ultrasound.

## 2017-09-14 ENCOUNTER — Telehealth: Payer: Self-pay | Admitting: Obstetrics & Gynecology

## 2017-09-14 NOTE — Telephone Encounter (Signed)
FYI: Patient cancelled ultrasound for 09/22/17 because she has established OB care at [facility].

## 2017-09-22 ENCOUNTER — Other Ambulatory Visit: Payer: BLUE CROSS/BLUE SHIELD

## 2017-09-22 ENCOUNTER — Other Ambulatory Visit: Payer: BLUE CROSS/BLUE SHIELD | Admitting: Obstetrics & Gynecology

## 2017-10-11 LAB — OB RESULTS CONSOLE HIV ANTIBODY (ROUTINE TESTING): HIV: NONREACTIVE

## 2017-10-11 LAB — OB RESULTS CONSOLE ABO/RH: RH Type: POSITIVE

## 2017-10-11 LAB — OB RESULTS CONSOLE RUBELLA ANTIBODY, IGM: Rubella: IMMUNE

## 2017-10-11 LAB — OB RESULTS CONSOLE HEPATITIS B SURFACE ANTIGEN: HEP B S AG: NEGATIVE

## 2017-10-11 LAB — OB RESULTS CONSOLE RPR: RPR: NONREACTIVE

## 2017-10-11 LAB — OB RESULTS CONSOLE ANTIBODY SCREEN: ANTIBODY SCREEN: NEGATIVE

## 2017-10-11 LAB — OB RESULTS CONSOLE GC/CHLAMYDIA
Chlamydia: NEGATIVE
Gonorrhea: NEGATIVE

## 2017-10-11 LAB — OB RESULTS CONSOLE GBS: GBS: POSITIVE

## 2017-10-28 ENCOUNTER — Telehealth (HOSPITAL_COMMUNITY): Payer: Self-pay | Admitting: MS"

## 2017-10-28 NOTE — Telephone Encounter (Signed)
Patient called to inquire about scheduled genetic counseling appointment on 11/07/17. 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 Discussed that we don't have records yet so could only generally speak to general concepts about intermediate carrier for Fragile X and provided general information to patient about intermediate FMR1 alleles. Patient plans to discuss with her husband about whether or not they would like to have genetic counseling appointment. She stated that the originally scheduled 11/07/17 appointment does not work, as he is out of town. They will call back if they would like to reschedule genetic counseling.   Santiago Glad Rubby Barbary 10/28/2017 3:30 PM

## 2017-11-07 ENCOUNTER — Encounter (HOSPITAL_COMMUNITY): Payer: BLUE CROSS/BLUE SHIELD

## 2018-04-18 ENCOUNTER — Encounter (HOSPITAL_COMMUNITY): Payer: Self-pay | Admitting: *Deleted

## 2018-04-24 DIAGNOSIS — O321XX0 Maternal care for breech presentation, not applicable or unspecified: Secondary | ICD-10-CM | POA: Diagnosis present

## 2018-04-24 HISTORY — DX: Maternal care for breech presentation, not applicable or unspecified: O32.1XX0

## 2018-04-30 NOTE — H&P (Signed)
Grace Zhang is a 27 y.o. female G1P0 at 74 1/7 weeks (EDD 05/09/18 by LMP c/w 10 week [place])  presenting for scheduled c-section for persistent breech presentataion.  Prenatal care has been significant for amniotic fluid volume in the low normal range, but always adequate with a 2-3cm pocket.  Followed with serial AFI checks and fetal NST's due to patient anxiety and uncertainty about normal fetal kick counts.   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  No further antenatal testing needed, but patient may see genetic counselor pp Isolated left choroid plexus cyst--resolved on f/u scan. OB History    Gravida  1   Para  0   Term  0   Preterm  0   AB  0   Living  0     SAB  0   TAB  0   Ectopic  0   Multiple  0   Live Births             Past Medical History:  Diagnosis Date  . Acne - sees dermatologist 11/08/2013  . Frequent headaches    Past Surgical History:  Procedure Laterality Date  . BUNIONECTOMY Right 10/14  . EYE SURGERY    . REFRACTIVE SURGERY     Family History: family history includes Cancer in her maternal grandfather; Osteoporosis in her maternal grandmother; Stroke in her maternal grandfather; Thyroid disease in her mother. Social History:  reports that she has never smoked. She has never used smokeless tobacco. She reports previous alcohol use. She reports that she does not use drugs.     Maternal Diabetes: No Genetic Screening: Normal Maternal Ultrasounds/Referrals: Abnormal:  Findings:   Other:low normal amniotic fluid, followed, Isolated CP cyst Fetal Ultrasounds or other Referrals:  None Maternal Substance Abuse:  No Significant Maternal Medications:  None Significant Maternal Lab Results:  Lab values include: Group B Strep positive Other Comments:  None  Review of Systems  Eyes: Negative for blurred vision.  Cardiovascular:  Negative for chest pain.  Gastrointestinal: Negative for abdominal pain.   Maternal Medical History:  Contractions: Frequency: irregular.   Perceived severity is mild.    Fetal activity: Perceived fetal activity is normal.    Prenatal complications: Oligohydramnios (resolved on f/u scan).   Prenatal Complications - Diabetes: none.      Last menstrual period 08/02/2017. Maternal Exam:  Uterine Assessment: Contraction strength is mild.  Contraction frequency is irregular.   Abdomen: Patient reports no abdominal tenderness. Estimated fetal weight is 6 1/2-7 lbs.   Fetal presentation: breech  Introitus: Normal vulva. Normal vagina.    Physical Exam  Constitutional: She appears well-developed.  Cardiovascular: Normal rate and regular rhythm.  Respiratory: Effort normal and breath sounds normal.  GI: Soft.  Genitourinary:    Vulva and vagina normal.   Neurological: She is alert.  Psychiatric: She has a normal mood and affect.    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 She is ready to proceed.  Logan Bores 04/30/2018, 8:25 PM

## 2018-05-02 ENCOUNTER — Ambulatory Visit: Payer: BLUE CROSS/BLUE SHIELD | Admitting: Certified Nurse Midwife

## 2018-05-02 ENCOUNTER — Encounter (HOSPITAL_COMMUNITY)
Admission: RE | Admit: 2018-05-02 | Discharge: 2018-05-02 | Disposition: A | Discharge status: Home / Self Care | Payer: BLUE CROSS/BLUE SHIELD | Source: Ambulatory Visit | Attending: Obstetrics and Gynecology | Admitting: Obstetrics and Gynecology

## 2018-05-02 LAB — TYPE AND SCREEN
ABO/RH(D): O POS
Antibody Screen: NEGATIVE

## 2018-05-02 LAB — CBC
HCT: 38.5 % (ref 36.0–46.0)
Hemoglobin: 13.3 g/dL (ref 12.0–15.0)
MCH: 33.7 pg (ref 26.0–34.0)
MCHC: 34.5 g/dL (ref 30.0–36.0)
MCV: 97.5 fL (ref 80.0–100.0)
Platelets: 104 10*3/uL — ABNORMAL LOW (ref 150–400)
RBC: 3.95 MIL/uL (ref 3.87–5.11)
RDW: 12 % (ref 11.5–15.5)
WBC: 10 10*3/uL (ref 4.0–10.5)
nRBC: 0 % (ref 0.0–0.2)

## 2018-05-02 LAB — ABO/RH: ABO/RH(D): O POS

## 2018-05-02 NOTE — Patient Instructions (Signed)
Grace Zhang  05/02/2018   Your procedure is scheduled on:  05/03/2018  Enter through the Main Entrance of [facility] [place] up the phone at the desk and dial [phone]  Call this number if you have problems the morning of surgery:[phone]  Remember:   Do not eat food:(After Midnight) Desps de medianoche.  Do not drink clear liquids: (After Midnight) Desps de medianoche.  Take these medicines the morning of surgery with A SIP OF WATER: none   Do not wear jewelry, make-up or nail polish.  Do not wear lotions, powders, or perfumes. Do not wear deodorant.  Do not shave 48 hours prior to surgery.  Do not bring valuables to the hospital.  [facility] is not   responsible for any belongings or valuables brought to the hospital.  Contacts, dentures or bridgework may not be worn into surgery.  Leave suitcase in the car. After surgery it may be brought to your room.  For patients admitted to the hospital, checkout time is 11:00 AM the day of              discharge.    N/A   Please read over the following fact sheets that you were given:   Surgical Site Infection Prevention

## 2018-05-03 ENCOUNTER — Inpatient Hospital Stay (HOSPITAL_COMMUNITY)
Admission: AD | Admit: 2018-05-03 | Discharge: 2018-05-06 | DRG: 787 | Disposition: A | Discharge status: Home / Self Care | Payer: BLUE CROSS/BLUE SHIELD | Attending: Obstetrics and Gynecology | Admitting: Obstetrics and Gynecology

## 2018-05-03 ENCOUNTER — Encounter (HOSPITAL_COMMUNITY)
Admission: AD | Disposition: A | Discharge status: Home / Self Care | Payer: Self-pay | Source: Home / Self Care | Attending: Obstetrics and Gynecology

## 2018-05-03 ENCOUNTER — Inpatient Hospital Stay (HOSPITAL_COMMUNITY): Payer: BLUE CROSS/BLUE SHIELD | Admitting: Anesthesiology

## 2018-05-03 ENCOUNTER — Encounter (HOSPITAL_COMMUNITY): Payer: Self-pay | Admitting: *Deleted

## 2018-05-03 DIAGNOSIS — O99824 Streptococcus B carrier state complicating childbirth: Secondary | ICD-10-CM | POA: Diagnosis present

## 2018-05-03 DIAGNOSIS — Z3A39 39 weeks gestation of pregnancy: Secondary | ICD-10-CM

## 2018-05-03 DIAGNOSIS — O9912 Other diseases of the blood and blood-forming organs and certain disorders involving the immune mechanism complicating childbirth: Secondary | ICD-10-CM | POA: Diagnosis present

## 2018-05-03 DIAGNOSIS — R21 Rash and other nonspecific skin eruption: Secondary | ICD-10-CM | POA: Diagnosis not present

## 2018-05-03 DIAGNOSIS — O321XX0 Maternal care for breech presentation, not applicable or unspecified: Secondary | ICD-10-CM | POA: Diagnosis present

## 2018-05-03 DIAGNOSIS — O9089 Other complications of the puerperium, not elsewhere classified: Secondary | ICD-10-CM | POA: Diagnosis not present

## 2018-05-03 DIAGNOSIS — Z98891 History of uterine scar from previous surgery: Secondary | ICD-10-CM

## 2018-05-03 DIAGNOSIS — D6959 Other secondary thrombocytopenia: Secondary | ICD-10-CM | POA: Diagnosis present

## 2018-05-03 LAB — CBC
HCT: 39.8 % (ref 36.0–46.0)
Hemoglobin: 13.7 g/dL (ref 12.0–15.0)
MCH: 33.9 pg (ref 26.0–34.0)
MCHC: 34.4 g/dL (ref 30.0–36.0)
MCV: 98.5 fL (ref 80.0–100.0)
NRBC: 0 % (ref 0.0–0.2)
Platelets: 119 10*3/uL — ABNORMAL LOW (ref 150–400)
RBC: 4.04 MIL/uL (ref 3.87–5.11)
RDW: 12 % (ref 11.5–15.5)
WBC: 12 10*3/uL — ABNORMAL HIGH (ref 4.0–10.5)

## 2018-05-03 LAB — RPR: RPR Ser Ql: NONREACTIVE

## 2018-05-03 SURGERY — Surgical Case
Anesthesia: Spinal

## 2018-05-03 MED ORDER — KETOROLAC TROMETHAMINE 30 MG/ML IJ SOLN: 30.0000 mg | Freq: Once | INTRAMUSCULAR | Status: DC | PRN

## 2018-05-03 MED ORDER — PRENATAL MULTIVITAMIN CH
1.0000 | ORAL_TABLET | Freq: Every day | ORAL | Status: DC
  Administered 2018-05-04 – 2018-05-05 (×2): 1 via ORAL
  Filled 2018-05-03 (×2): qty 1

## 2018-05-03 MED ORDER — LACTATED RINGERS IV SOLN
INTRAVENOUS | Status: DC
  Administered 2018-05-03 (×2): via INTRAVENOUS

## 2018-05-03 MED ORDER — CEFAZOLIN SODIUM-DEXTROSE 2-4 GM/100ML-% IV SOLN
2.0000 g | Freq: Once | INTRAVENOUS | Status: AC
  Administered 2018-05-03: 2 g via INTRAVENOUS
  Filled 2018-05-03: qty 100

## 2018-05-03 MED ORDER — OXYTOCIN 10 UNIT/ML IJ SOLN
INTRAVENOUS | Status: DC | PRN
  Administered 2018-05-03: 40 [IU] via INTRAVENOUS

## 2018-05-03 MED ORDER — OXYCODONE HCL 5 MG/5ML PO SOLN: 5.0000 mg | Freq: Once | ORAL | Status: DC | PRN

## 2018-05-03 MED ORDER — SCOPOLAMINE 1 MG/3DAYS TD PT72
MEDICATED_PATCH | TRANSDERMAL | Status: AC
  Filled 2018-05-03: qty 1

## 2018-05-03 MED ORDER — SENNOSIDES-DOCUSATE SODIUM 8.6-50 MG PO TABS
2.0000 | ORAL_TABLET | ORAL | Status: DC
  Administered 2018-05-03 – 2018-05-05 (×4): 2 via ORAL
  Filled 2018-05-03 (×3): qty 2

## 2018-05-03 MED ORDER — DIPHENHYDRAMINE HCL 25 MG PO CAPS
25.0000 mg | ORAL_CAPSULE | ORAL | Status: DC | PRN
  Administered 2018-05-04: 25 mg via ORAL
  Filled 2018-05-03 (×4): qty 1

## 2018-05-03 MED ORDER — HYDROMORPHONE HCL 1 MG/ML IJ SOLN
INTRAMUSCULAR | Status: AC
  Filled 2018-05-03: qty 1

## 2018-05-03 MED ORDER — BUPIVACAINE IN DEXTROSE 0.75-8.25 % IT SOLN
INTRATHECAL | Status: DC | PRN
  Administered 2018-05-03: 1.6 mL via INTRATHECAL

## 2018-05-03 MED ORDER — MORPHINE SULFATE (PF) 0.5 MG/ML IJ SOLN
INTRAMUSCULAR | Status: DC | PRN
  Administered 2018-05-03: .15 mg via INTRATHECAL

## 2018-05-03 MED ORDER — FENTANYL CITRATE (PF) 100 MCG/2ML IJ SOLN
INTRAMUSCULAR | Status: DC | PRN
  Administered 2018-05-03: 15 ug via INTRATHECAL

## 2018-05-03 MED ORDER — SIMETHICONE 80 MG PO CHEW
80.0000 mg | CHEWABLE_TABLET | Freq: Three times a day (TID) | ORAL | Status: DC
  Administered 2018-05-03 – 2018-05-06 (×9): 80 mg via ORAL
  Filled 2018-05-03 (×9): qty 1

## 2018-05-03 MED ORDER — IBUPROFEN 800 MG PO TABS
800.0000 mg | ORAL_TABLET | Freq: Three times a day (TID) | ORAL | Status: DC
  Administered 2018-05-03 – 2018-05-06 (×8): 800 mg via ORAL
  Filled 2018-05-03 (×8): qty 1

## 2018-05-03 MED ORDER — WITCH HAZEL-GLYCERIN EX PADS: 1.0000 "application " | MEDICATED_PAD | CUTANEOUS | Status: DC | PRN

## 2018-05-03 MED ORDER — SCOPOLAMINE 1 MG/3DAYS TD PT72
1.0000 | MEDICATED_PATCH | Freq: Once | TRANSDERMAL | Status: AC
  Administered 2018-05-03: 1.5 mg via TRANSDERMAL

## 2018-05-03 MED ORDER — SODIUM CHLORIDE 0.9 % IR SOLN
Status: DC | PRN
  Administered 2018-05-03: 1

## 2018-05-03 MED ORDER — NALBUPHINE HCL 10 MG/ML IJ SOLN: 5.0000 mg | Freq: Once | INTRAMUSCULAR | Status: DC | PRN

## 2018-05-03 MED ORDER — DIPHENHYDRAMINE HCL 50 MG/ML IJ SOLN: 12.5000 mg | INTRAMUSCULAR | Status: DC | PRN

## 2018-05-03 MED ORDER — DEXAMETHASONE SODIUM PHOSPHATE 10 MG/ML IJ SOLN
INTRAMUSCULAR | Status: DC | PRN
  Administered 2018-05-03: 10 mg via INTRAVENOUS

## 2018-05-03 MED ORDER — ZOLPIDEM TARTRATE 5 MG PO TABS: 5.0000 mg | ORAL_TABLET | Freq: Every evening | ORAL | Status: DC | PRN

## 2018-05-03 MED ORDER — SIMETHICONE 80 MG PO CHEW
80.0000 mg | CHEWABLE_TABLET | ORAL | Status: DC
  Administered 2018-05-04 – 2018-05-05 (×2): 80 mg via ORAL
  Filled 2018-05-03 (×3): qty 1

## 2018-05-03 MED ORDER — LACTATED RINGERS IV SOLN
INTRAVENOUS | Status: DC | PRN
  Administered 2018-05-03: 09:00:00 via INTRAVENOUS

## 2018-05-03 MED ORDER — TETANUS-DIPHTH-ACELL PERTUSSIS 5-2.5-18.5 LF-MCG/0.5 IM SUSP: 0.5000 mL | Freq: Once | INTRAMUSCULAR | Status: DC

## 2018-05-03 MED ORDER — DIBUCAINE 1 % RE OINT: 1.0000 "application " | TOPICAL_OINTMENT | RECTAL | Status: DC | PRN

## 2018-05-03 MED ORDER — STERILE WATER FOR IRRIGATION IR SOLN
Status: DC | PRN
  Administered 2018-05-03: 1000 mL

## 2018-05-03 MED ORDER — LACTATED RINGERS IV SOLN
INTRAVENOUS | Status: DC
  Administered 2018-05-03: 23:00:00 via INTRAVENOUS

## 2018-05-03 MED ORDER — ONDANSETRON HCL 4 MG/2ML IJ SOLN
INTRAMUSCULAR | Status: DC | PRN
  Administered 2018-05-03: 4 mg via INTRAVENOUS

## 2018-05-03 MED ORDER — ONDANSETRON HCL 4 MG/2ML IJ SOLN: 4.0000 mg | Freq: Three times a day (TID) | INTRAMUSCULAR | Status: DC | PRN

## 2018-05-03 MED ORDER — DIPHENHYDRAMINE HCL 25 MG PO CAPS
25.0000 mg | ORAL_CAPSULE | Freq: Four times a day (QID) | ORAL | Status: DC | PRN
  Administered 2018-05-03 (×2): 25 mg via ORAL

## 2018-05-03 MED ORDER — MEPERIDINE HCL 25 MG/ML IJ SOLN: 6.2500 mg | INTRAMUSCULAR | Status: DC | PRN

## 2018-05-03 MED ORDER — NALBUPHINE HCL 10 MG/ML IJ SOLN: 5.0000 mg | INTRAMUSCULAR | Status: DC | PRN

## 2018-05-03 MED ORDER — PHENYLEPHRINE 8 MG IN D5W 100 ML (0.08MG/ML) PREMIX OPTIME
INJECTION | INTRAVENOUS | Status: DC | PRN
  Administered 2018-05-03: 60 ug/min via INTRAVENOUS

## 2018-05-03 MED ORDER — OXYCODONE HCL 5 MG PO TABS
5.0000 mg | ORAL_TABLET | ORAL | Status: DC | PRN
  Administered 2018-05-04: 5 mg via ORAL
  Administered 2018-05-04: 10 mg via ORAL
  Administered 2018-05-04 – 2018-05-05 (×6): 5 mg via ORAL
  Filled 2018-05-03 (×4): qty 1
  Filled 2018-05-03: qty 2
  Filled 2018-05-03 (×3): qty 1

## 2018-05-03 MED ORDER — MENTHOL 3 MG MT LOZG: 1.0000 | LOZENGE | OROMUCOSAL | Status: DC | PRN

## 2018-05-03 MED ORDER — SIMETHICONE 80 MG PO CHEW: 80.0000 mg | CHEWABLE_TABLET | ORAL | Status: DC | PRN

## 2018-05-03 MED ORDER — OXYCODONE HCL 5 MG PO TABS: 5.0000 mg | ORAL_TABLET | Freq: Once | ORAL | Status: DC | PRN

## 2018-05-03 MED ORDER — NALOXONE HCL 4 MG/10ML IJ SOLN
1.0000 ug/kg/h | INTRAVENOUS | Status: DC | PRN
  Filled 2018-05-03: qty 5

## 2018-05-03 MED ORDER — OXYTOCIN 40 UNITS IN LACTATED RINGERS INFUSION - SIMPLE MED: 2.5000 [IU]/h | INTRAVENOUS | Status: AC

## 2018-05-03 MED ORDER — SODIUM CHLORIDE 0.9% FLUSH: 3.0000 mL | INTRAVENOUS | Status: DC | PRN

## 2018-05-03 MED ORDER — COCONUT OIL OIL: 1.0000 "application " | TOPICAL_OIL | Status: DC | PRN

## 2018-05-03 MED ORDER — HYDROMORPHONE HCL 1 MG/ML IJ SOLN
0.2500 mg | INTRAMUSCULAR | Status: DC | PRN
  Administered 2018-05-03: 0.25 mg via INTRAVENOUS
  Administered 2018-05-03: 0.5 mg via INTRAVENOUS
  Administered 2018-05-03: 0.25 mg via INTRAVENOUS
  Administered 2018-05-03 (×2): 0.5 mg via INTRAVENOUS

## 2018-05-03 MED ORDER — KETOROLAC TROMETHAMINE 30 MG/ML IJ SOLN
INTRAMUSCULAR | Status: AC
  Filled 2018-05-03: qty 1

## 2018-05-03 MED ORDER — PROMETHAZINE HCL 25 MG/ML IJ SOLN: 6.2500 mg | INTRAMUSCULAR | Status: DC | PRN

## 2018-05-03 MED ORDER — NALOXONE HCL 0.4 MG/ML IJ SOLN: 0.4000 mg | INTRAMUSCULAR | Status: DC | PRN

## 2018-05-03 SURGICAL SUPPLY — 37 items
BENZOIN TINCTURE PRP APPL 2/3 (GAUZE/BANDAGES/DRESSINGS) ×3 IMPLANT
CHLORAPREP W/TINT 26ML (MISCELLANEOUS) ×3 IMPLANT
CLAMP CORD UMBIL (MISCELLANEOUS) IMPLANT
CLOSURE STERI-STRIP 1/2X4 (GAUZE/BANDAGES/DRESSINGS) ×1
CLOSURE WOUND 1/2 X4 (GAUZE/BANDAGES/DRESSINGS)
CLOTH BEACON ORANGE TIMEOUT ST (SAFETY) ×3 IMPLANT
CLSR STERI-STRIP ANTIMIC 1/2X4 (GAUZE/BANDAGES/DRESSINGS) ×2 IMPLANT
DRSG OPSITE POSTOP 4X10 (GAUZE/BANDAGES/DRESSINGS) ×3 IMPLANT
ELECT REM PT RETURN 9FT ADLT (ELECTROSURGICAL) ×3
ELECTRODE REM PT RTRN 9FT ADLT (ELECTROSURGICAL) ×1 IMPLANT
EXTRACTOR VACUUM KIWI (MISCELLANEOUS) IMPLANT
GLOVE BIO SURGEON STRL SZ 6.5 (GLOVE) ×2 IMPLANT
GLOVE BIO SURGEONS STRL SZ 6.5 (GLOVE) ×1
GLOVE BIOGEL PI IND STRL 7.0 (GLOVE) ×1 IMPLANT
GLOVE BIOGEL PI INDICATOR 7.0 (GLOVE) ×2
GOWN STRL REUS W/TWL LRG LVL3 (GOWN DISPOSABLE) ×6 IMPLANT
KIT ABG SYR 3ML LUER SLIP (SYRINGE) IMPLANT
NEEDLE HYPO 25X5/8 SAFETYGLIDE (NEEDLE) IMPLANT
NS IRRIG 1000ML POUR BTL (IV SOLUTION) ×3 IMPLANT
PACK C SECTION WH (CUSTOM PROCEDURE TRAY) ×3 IMPLANT
PAD OB MATERNITY 4.3X12.25 (PERSONAL CARE ITEMS) ×3 IMPLANT
PENCIL SMOKE EVAC W/HOLSTER (ELECTROSURGICAL) ×3 IMPLANT
RTRCTR C-SECT PINK 25CM LRG (MISCELLANEOUS) ×3 IMPLANT
SPONGE LAP 18X18 RF (DISPOSABLE) ×9 IMPLANT
STRIP CLOSURE SKIN 1/2X4 (GAUZE/BANDAGES/DRESSINGS) IMPLANT
SUT CHROMIC 1 CTX 36 (SUTURE) ×6 IMPLANT
SUT PLAIN 0 NONE (SUTURE) IMPLANT
SUT PLAIN 2 0 XLH (SUTURE) ×3 IMPLANT
SUT VIC AB 0 CT1 27 (SUTURE) ×4
SUT VIC AB 0 CT1 27XBRD ANBCTR (SUTURE) ×2 IMPLANT
SUT VIC AB 2-0 CT1 27 (SUTURE) ×2
SUT VIC AB 2-0 CT1 TAPERPNT 27 (SUTURE) ×1 IMPLANT
SUT VIC AB 3-0 CT1 27 (SUTURE)
SUT VIC AB 3-0 CT1 TAPERPNT 27 (SUTURE) IMPLANT
SUT VIC AB 4-0 KS 27 (SUTURE) ×3 IMPLANT
TOWEL OR 17X24 6PK STRL BLUE (TOWEL DISPOSABLE) ×3 IMPLANT
TRAY FOLEY W/BAG SLVR 14FR LF (SET/KITS/TRAYS/PACK) ×3 IMPLANT

## 2018-05-03 NOTE — Anesthesia Postprocedure Evaluation (Signed)
Anesthesia Post Note  Patient: Grace Zhang  Procedure(s) Performed: CESAREAN SECTION (N/A )     Patient location during evaluation: PACU Anesthesia Type: Spinal Level of consciousness: oriented and awake and alert Pain management: pain level controlled Vital Signs Assessment: post-procedure vital signs reviewed and stable Respiratory status: spontaneous breathing and respiratory function stable Cardiovascular status: blood pressure returned to baseline and stable Postop Assessment: no headache, no backache and no apparent nausea or vomiting Anesthetic complications: no    Last Vitals:  Vitals:   05/03/18 1030 05/03/18 1052  BP: 112/71 109/73  Pulse: 77 69  Resp: 20 18  Temp: 36.8 C 37 C  SpO2:  99%    Last Pain:  Vitals:   05/03/18 1052  TempSrc: Oral  PainSc:    Pain Goal:                 Lynda Rainwater

## 2018-05-03 NOTE — Progress Notes (Signed)
Patient ID: Grace Zhang, female   DOB: 1990-07-24, 28 y.o.   MRN: [id number] Per pt no changes in dictated H&P.  Mild gestational thrombocytopenia stable.  Ready to proceed.  Brief exam WNL.

## 2018-05-03 NOTE — Lactation Note (Signed)
This note was copied from a baby's chart. Lactation Consultation Note  Patient Name: Grace Zhang [id number] Date: 05/03/2018 Reason for consult: Initial assessment;1st time breastfeeding;Primapara;Term  P1 mother whose infant is now 22 hours old.  RN requested latch assistance  Nurse tech assisting mother as I arrived.  Mother's breasts are soft and non tender and nipples are short shafted.  Baby was at the breast but not sucking well.  He was sleepy.  Offered to continue assisting with latch and mother accepted.  Baby was not willing to latch and mother asked about a nipple shield.  Due to the nature of her nipples I believe that this is warranted at this time.  #20 NS obtained and taught mother how to apply to breast.  Mother required repeated attempts to fit it securely and was successful.    It appears that baby may have a short anterior frenulum but he was able to open wide and latch after much stimulation to awaken him.  Once positioned appropriately at the breast he began sucking with rhythmic jaw movements and gentle stimulation.  Demonstrated open mouth to base of nipple shield for mother and father to observe.  Baby was latched deeply and mother denied pain with latching.  Mother is also very sleepy and was having a hard time staying awake.  She had received Benadryl earlier in the shift.  Demonstrated breast compressions and techniques to help keep baby awake at the breast.    Encouraged feeding 8-12 times/24 hours or sooner if baby shows feeding cues.  Reviewed feeding cues.  Suggested hand expression before/after feedings to help increase milk supply.  Offered to initiate DEBP to help with stimulation and milk supply and mother willing to pump.  Pump parts, assembly, disassembly and cleaning reviewed.  Asked father to help mother and to clean the parts when needed.  He was willing to do this.  Both parents are very receptive and eager to learn.  #24 flange size is appropriate at this  time.  Demonstrated finger feeding if mother obtains any colostrum drops.  Prior to latching mother was able to express some large drops of colostrum which were finger fed back to baby.    Reviewed breast shells and manual pump with mother and she will begin using breast shells in the a.m.  She will use manual pump tonight to help evert nipples.  She will call for assistance as needed with latching.  RN updated and in room to finish assessment prior to mother resting.     Maternal Data Formula Feeding for Exclusion: No Has patient been taught Hand Expression?: Yes Does the patient have breastfeeding experience prior to this delivery?: No  Feeding Feeding Type: Breast Fed  LATCH Score Latch: Grasps breast easily, tongue down, lips flanged, rhythmical sucking.  Audible Swallowing: None  Type of Nipple: Everted at rest and after stimulation(short shafted)  Comfort (Breast/Nipple): Soft / non-tender  Hold (Positioning): Assistance needed to correctly position infant at breast and maintain latch.  LATCH Score: 7  Interventions Interventions: Breast feeding basics reviewed;Assisted with latch;Skin to skin;Breast massage;Hand express;Pre-pump if needed;Breast compression;Hand pump;Shells;Position options;Support pillows;Adjust position;DEBP  Lactation Tools Discussed/Used Tools: Shells;Pump;Nipple Shields Nipple shield size: 20 Shell Type: Inverted Breast pump type: Double-Electric Breast Pump WIC Program: No Pump Review: Setup, frequency, and cleaning;Milk Storage Initiated by:: Grace Zhang Date initiated:: 05/04/18   Consult Status Consult Status: Follow-up Date: 05/04/18 Follow-up type: In-patient    Grace Zhang 05/03/2018, 9:01 PM

## 2018-05-03 NOTE — Anesthesia Procedure Notes (Signed)
Spinal  Patient location during procedure: OB End time: 05/03/2018 8:17 AM Staffing Anesthesiologist: Lynda Rainwater, MD Performed: anesthesiologist  Preanesthetic Checklist Completed: patient identified, surgical consent, pre-op evaluation, timeout performed, IV checked, risks and benefits discussed and monitors and equipment checked Spinal Block Patient position: sitting Prep: site prepped and draped and DuraPrep Patient monitoring: heart rate, cardiac monitor, continuous pulse ox and blood pressure Approach: midline Location: L3-4 Injection technique: single-shot Needle Needle type: Pencan  Needle gauge: 24 G Needle length: 10 cm Assessment Sensory level: T4

## 2018-05-03 NOTE — Lactation Note (Signed)
This note was copied from a baby's chart. Lactation Consultation Note  Patient Name: Boy Staria Birkhead PRFFM'B Date: 05/03/2018 Reason for consult: Initial assessment;Term;1st time breastfeeding;Primapara;Other (Comment)(parents asked LC to wrapp baby in their blanket and baby woke up )  Baby is 3 1/2 hours old  Has been to the breast in LD for 30 mins . Per mom and dad baby recently fed at 1200 for 10 mins.  Mom requested for LC to check diaper / dry and baby woke up.  Mom requested to latch on the right breast / LC noted short shaft nipples, semi flat, compressible areola  On the right. Baby opens wide/ LC noted a short anterior frenulum, and noticeable midline notch.  LC did not discuss this finding with mom or dad, will reassess.  Baby able to latch for 3 mins with strong suck and release, sleepy on and off.  LC reviewed basics of breast feeding and feeding behaviors of a newborn ( term)  Mom mentioned she had breast changes with pregnancy. Also is aware she has semi- flat nipples  And may need help to latch.  LC recommended - breast shells when not STS or sleeping ( RN will help with the IV )  And prior to every latch - breast massage, hand express , pre-pump with hand pump.  Due to baby not being overly hungry and ready to feed / LC was unable to get a good indication  Of whether a Nipple Shield may be needed to get a better latch.  LC explained to mom and dad a NS may be a needed breast feeding tool .  Mom and dad receptive to teaching.  Mother informed of post-discharge support and given phone number to the lactation department, including services for phone call assistance; out-patient appointments; and breastfeeding support group. List of other breastfeeding resources in the community given in the handout. Encouraged mother to call for problems or concerns related to breastfeeding.    Maternal Data Has patient been taught Hand Expression?: Yes(mom mentioned she had breast changes  1st trimester ) Does the patient have breastfeeding experience prior to this delivery?: No  Feeding Feeding Type: Breast Fed  LATCH Score Latch: Grasps breast easily, tongue down, lips flanged, rhythmical sucking.  Audible Swallowing: A few with stimulation  Type of Nipple: Everted at rest and after stimulation(semi flat / short shaft nipple )  Comfort (Breast/Nipple): Soft / non-tender  Hold (Positioning): Full assist, staff holds infant at breast  LATCH Score: 7  Interventions Interventions: Breast feeding basics reviewed;Assisted with latch;Skin to skin;Breast massage;Hand express;Breast compression;Adjust position;Support pillows;Position options;Shells;Hand pump  Lactation Tools Discussed/Used Tools: Pump;Shells Shell Type: Inverted Breast pump type: Manual WIC Program: No Pump Review: (hand pump was already in the room )   Consult Status Consult Status: Follow-up Date: 05/03/18 Follow-up type: In-patient    [place] 05/03/2018, 1:15 PM

## 2018-05-03 NOTE — Addendum Note (Signed)
Addendum  created 05/03/18 1354 by Asher Muir, CRNA   Clinical Note Signed

## 2018-05-03 NOTE — Anesthesia Postprocedure Evaluation (Signed)
Anesthesia Post Note  Patient: [name]  Procedure(s) Performed: CESAREAN SECTION (N/A )     Patient location during evaluation: Mother Baby Anesthesia Type: Spinal Level of consciousness: awake Pain management: satisfactory to patient Vital Signs Assessment: post-procedure vital signs reviewed and stable Respiratory status: spontaneous breathing Cardiovascular status: stable Anesthetic complications: no    Last Vitals:  Vitals:   05/03/18 1052 05/03/18 1252  BP: 109/73 111/68  Pulse: 69 (!) 57  Resp: 18 16  Temp: 37 C 36.9 C  SpO2: 99% 95%    Last Pain:  Vitals:   05/03/18 1252  TempSrc: Oral  PainSc: 4    Pain Goal:                 [employer]

## 2018-05-03 NOTE — Transfer of Care (Signed)
Immediate Anesthesia Transfer of Care Note  Patient: Grace Zhang  Procedure(s) Performed: CESAREAN SECTION (N/A )  Patient Location: PACU  Anesthesia Type:Spinal  Level of Consciousness: awake and alert  , oriented  Airway & Oxygen Therapy: Patient Spontanous Breathing  Post-op Assessment: Report given to RN  Post vital signs: Reviewed and stable  Last Vitals:  Vitals Value Taken Time  BP 101/59 05/03/2018  9:30 AM  Temp    Pulse 69 05/03/2018  9:31 AM  Resp 11 05/03/2018  9:31 AM  SpO2 100 % 05/03/2018  9:31 AM  Vitals shown include unvalidated device data.  Last Pain:  Vitals:   05/03/18 0648  TempSrc: Oral         Complications: No apparent anesthesia complications

## 2018-05-03 NOTE — Op Note (Signed)
Operative Note    Preoperative Diagnosis Term pregnancy at 39 1/7 weeks Breech presentation Mild gestational thrombocytopenia  Postoperative Diagnosis Same  Procedure Primary low transverse c-section with 2 layer closure of uterus  Surgeon Paula Compton, MD  Anesthesia Spinal  Fluids: EBL 371mL UOP 110mL clear IVF 2127mL LR  Findings A viable female infant in the breech presentation  Apgars 9, 9 Weight pending.  Normal uterus tubes and ovaries  Specimen Placenta to L&D  Procedure Note   Patient was taken to the operating room where spinal anesthesia was obtained and found to be adequate by Allis clamp test. She was prepped and draped in the normal sterile fashion in the dorsal supine position with a leftward tilt. An appropriate time out was performed. A Pfannenstiel skin incision was then made with the scalpel and carried through to the underlying layer of fascia by sharp dissection and Bovie cautery. The fascia was nicked in the midline and the incision was extended laterally with Mayo scissors. The inferior aspect of the incision was grasped Coker clamps and dissected off the underlying rectus muscles. In a similar fashion the superior aspect was dissected off the rectus muscles. Rectus muscles were separated in the midline and the peritoneal cavity entered bluntly. The peritoneal incision was then extended both superiorly and inferiorly with careful attention to avoid both bowel and bladder. The Alexis self-retaining wound retractor was then placed within the incision and the lower uterine segment exposed. The bladder flap was developed with Metzenbaum scissors and pushed away from the lower uterine segment. The lower uterine segment was then incised in a transverse fashion and the cavity itself entered bluntly. The incision was extended bluntly. The infant's bottom was then lifted and delivered from the incision without difficulty. The remainder of the infant delivered with  the arms reduced across the chest and the head delivered in a flexed position. The nose and mouth were then bulb suctioned with the cord clamped and cut as well after a one minute delay. The infant was handed off to the waiting pediatricians. The placenta was then spontaneously expressed from the uterus and the uterus cleared of all clots and debris with moist lap sponge. The uterine incision was then repaired in 2 layers the first layer was a running locked layer 1-0 chromic and the second an imbricating layer of the same suture. The tubes and ovaries were inspected and the gutters cleared of all clots and debris. The uterine incision was inspected and found to be hemostatic. All instruments and sponges as well as the Alexis retractor were then removed from the abdomen. The rectus muscles and peritoneum were then reapproximated with a running suture of 2-0 Vicryl. The fascia was then closed with 0 Vicryl in a running fashion. Subcutaneous tissue was reapproximated with 3-0 plain in a running fashion. The skin was closed with a subcuticular stitch of 4-0 Vicryl on a Keith needle and then reinforced with benzoin and Steri-Strips. At the conclusion of the procedure all instruments and sponge counts were correct. Patient was taken to the recovery room in good condition with her baby accompanying her skin to skin.

## 2018-05-03 NOTE — Anesthesia Preprocedure Evaluation (Signed)
Anesthesia Evaluation  Patient identified by MRN, date of birth, ID band Patient awake    Reviewed: Allergy & Precautions, NPO status , Patient's Chart, lab work & pertinent test results  Airway Mallampati: II  TM Distance: >3 FB Neck ROM: Full    Dental no notable dental hx.    Pulmonary neg pulmonary ROS,    Pulmonary exam normal breath sounds clear to auscultation       Cardiovascular negative cardio ROS Normal cardiovascular exam Rhythm:Regular Rate:Normal     Neuro/Psych negative neurological ROS  negative psych ROS   GI/Hepatic negative GI ROS, Neg liver ROS,   Endo/Other  negative endocrine ROS  Renal/GU negative Renal ROS  negative genitourinary   Musculoskeletal negative musculoskeletal ROS (+)   Abdominal   Peds negative pediatric ROS (+)  Hematology negative hematology ROS (+)   Anesthesia Other Findings   Reproductive/Obstetrics (+) Pregnancy                             Anesthesia Physical Anesthesia Plan  ASA: II  Anesthesia Plan: Spinal   Post-op Pain Management:    Induction:   PONV Risk Score and Plan: 2 and Treatment may vary due to age or medical condition  Airway Management Planned: Natural Airway  Additional Equipment:   Intra-op Plan:   Post-operative Plan:   Informed Consent: I have reviewed the patients History and Physical, chart, labs and discussed the procedure including the risks, benefits and alternatives for the proposed anesthesia with the patient or authorized representative who has indicated his/her understanding and acceptance.     Dental advisory given  Plan Discussed with: CRNA  Anesthesia Plan Comments:         Anesthesia Quick Evaluation  

## 2018-05-04 ENCOUNTER — Encounter (HOSPITAL_COMMUNITY): Payer: Self-pay | Admitting: Obstetrics and Gynecology

## 2018-05-04 LAB — CBC
HCT: 32 % — ABNORMAL LOW (ref 36.0–46.0)
Hemoglobin: 11.1 g/dL — ABNORMAL LOW (ref 12.0–15.0)
MCH: 33.8 pg (ref 26.0–34.0)
MCHC: 34.7 g/dL (ref 30.0–36.0)
MCV: 97.6 fL (ref 80.0–100.0)
PLATELETS: 100 10*3/uL — AB (ref 150–400)
RBC: 3.28 MIL/uL — ABNORMAL LOW (ref 3.87–5.11)
RDW: 12.1 % (ref 11.5–15.5)
WBC: 11.3 10*3/uL — ABNORMAL HIGH (ref 4.0–10.5)
nRBC: 0 % (ref 0.0–0.2)

## 2018-05-04 MED ORDER — HYDROCORTISONE 1 % EX CREA
TOPICAL_CREAM | Freq: Three times a day (TID) | CUTANEOUS | Status: DC
  Administered 2018-05-04 – 2018-05-06 (×5): via TOPICAL
  Filled 2018-05-04: qty 28

## 2018-05-04 MED ORDER — ACETAMINOPHEN 325 MG PO TABS
650.0000 mg | ORAL_TABLET | Freq: Four times a day (QID) | ORAL | Status: DC | PRN
  Administered 2018-05-04 – 2018-05-05 (×3): 650 mg via ORAL
  Filled 2018-05-04 (×4): qty 2

## 2018-05-04 NOTE — Progress Notes (Signed)
Subjective: Postpartum Day 1: Cesarean Delivery Patient reports incisional pain and tolerating PO.  Nl lochia, pain controlled  Objective: Vital signs in last 24 hours: Temp:  [98.1 F (36.7 C)-98.6 F (37 C)] 98.2 F (36.8 C) (01/02 0515) Pulse Rate:  [57-82] 78 (01/02 0515) Resp:  [14-20] 18 (01/02 0515) BP: (91-124)/(55-76) 91/59 (01/02 0515) SpO2:  [95 %-100 %] 97 % (01/02 0515)  Physical Exam:  General: alert and no distress Lochia: appropriate Uterine Fundus: firm Incision: healing well DVT Evaluation: No evidence of DVT seen on physical exam.  Recent Labs    05/03/18 0704 05/04/18 0535  HGB 13.7 11.1*  HCT 39.8 32.0*    Assessment/Plan: Status post Cesarean section. Doing well postoperatively.  Continue current care.  Thinh Cuccaro Bovard-Stuckert 05/04/2018, 8:14 AM

## 2018-05-04 NOTE — Progress Notes (Signed)
Patient ID: Grace Zhang, female   DOB: [birth year], 28 y.o.   MRN: [id number]   Scattered red maculo-papular lesions only on abdomen, some itching  In area of surgical prep - likely reaction to prep  Pt has taken benadryl Will also give hydrocortisone  Reassured AFVSS

## 2018-05-04 NOTE — Lactation Note (Signed)
This note was copied from a baby's chart. Lactation Consultation Note  Patient Name: Grace Zhang Date: 05/04/2018 Reason for consult: Follow-up assessment;1st time breastfeeding;Primapara;Term   Follow up with first time mom of 20 hour old infant. Infant with 10 BF for 15-30 minutes using a # 20 NS, BF attempt x 1, 2 voids and 4 stools in the last 24 hours. Infant weight 8 pounds 2.5 ounces with 4% weight loss since birth. LATCH scores 7-9. Infant asleep in GM arms. Infant was sleepy after circumcision.   Mom asked for assistance to latch infant to the breast, infant was awakened to feed and awakened easily. Mom prepumped and placed # 20 NS independently. Infant latched easily to the left breast. Infant sleepy despite stimulation. Infant removed from the breast and swaddled and handed to [place]. Enc mom to stimulate infant as needed to maintain suckling and massage/intermittently compress breast with feedings to maximize milk transfer.    Mom has DEBP set up, she has not pumped since yesterday. Enc mom to pump at least 4 x a day to promote and protect milk supply. Mom did pump after feeding infant. Enc mom to follow pumping with hand expression.   Infant noted to have recessed chin and high palate. Infant with anterior lingual frenulum with divot noted to center of tongue with elevation of the tongue. Infant  with strong suckle and good tongue extension and cupping when suckling on gloved finger. Infant with good tongue lateralization. Infant with decreased mid tongue elevation. Showed parents the restrictions and discussed that in some infants the tongue restriction can effect milk supply and transfer and would recommend follow up OP appt as well as frequent weight checks to ensure infant transferring well and ganiing well. Handout given with websites on Tongue and lip restrictions and local providers, as well as, reviewed how in some infants restrictions might effect milk supply and  transfer. Discussed continued pumping and supplementing with all pumped EBM in the hospital and at home to ensure growth and protect milk supply.   Additional # 20 NS given to mom at her request. Also gave her a # 24 NS and reviewed when to increase sizes upwards. Mom voiced understanding.   Mom to call out for feeding assistance as needed. Parents report they have no further questions at this time.      Maternal Data Formula Feeding for Exclusion: No Has patient been taught Hand Expression?: Yes Does the patient have breastfeeding experience prior to this delivery?: No  Feeding Feeding Type: Breast Fed  LATCH Score Latch: Repeated attempts needed to sustain latch, nipple held in mouth throughout feeding, stimulation needed to elicit sucking reflex.  Audible Swallowing: None  Type of Nipple: Everted at rest and after stimulation  Comfort (Breast/Nipple): Soft / non-tender  Hold (Positioning): No assistance needed to correctly position infant at breast.  LATCH Score: 7  Interventions Interventions: Breast feeding basics reviewed;Assisted with latch;Skin to skin;Breast massage;Breast compression;Hand express;Expressed milk;Support pillows  Lactation Tools Discussed/Used Tools: Nipple Shields Nipple shield size: 20 Shell Type: Inverted Breast pump type: Double-Electric Breast Pump WIC Program: No Pump Review: Setup, frequency, and cleaning;Milk Storage Initiated by:: reviewed and encouraged pumping 4-6 x a day post BF   Consult Status Consult Status: Follow-up Date: 05/05/18 Follow-up type: In-patient    Grace Zhang 05/04/2018, 3:10 PM

## 2018-05-05 ENCOUNTER — Other Ambulatory Visit: Payer: Self-pay

## 2018-05-05 MED ORDER — HYDROXYZINE HCL 25 MG PO TABS
25.0000 mg | ORAL_TABLET | Freq: Three times a day (TID) | ORAL | Status: DC
  Administered 2018-05-05 – 2018-05-06 (×4): 25 mg via ORAL
  Filled 2018-05-05 (×4): qty 1

## 2018-05-05 NOTE — Progress Notes (Signed)
Subjective: Postpartum Day 2: Cesarean Delivery Patient reports incisional pain, tolerating PO, + flatus and no problems voiding.  Pt reports pain moderately controlled. Rash on abdomen worsening despite hydrocortisone and benadryl- itches and hurts. She is bonding well with baby. Lochia - moderate; passed clot this am  Objective: Vital signs in last 24 hours: Temp:  [97.7 F (36.5 C)-98.3 F (36.8 C)] 98.3 F (36.8 C) (01/03 0552) Pulse Rate:  [63-75] 63 (01/03 0552) Resp:  [12-18] 16 (01/03 0552) BP: (98-125)/(52-68) 98/52 (01/03 0552) SpO2:  [96 %-98 %] 98 % (01/03 0552)  Physical Exam:  General: alert, cooperative and no distress Lochia: appropriate Uterine Fundus: firm Incision: no significant drainage, abdomen with maculopapular raised rash covering upper abdomen DVT Evaluation: No evidence of DVT seen on physical exam.  Recent Labs    05/03/18 0704 05/04/18 0535  HGB 13.7 11.1*  HCT 39.8 32.0*    Assessment/Plan: Status post Cesarean section. Doing well postoperatively.  Continue current care. Will switch from benadryl to vistaril; continue hydrocortisone cream; if worsens consider oral steroid   Isaiah Serge 05/05/2018, 9:19 AM

## 2018-05-05 NOTE — Progress Notes (Signed)
RN entered room to give medication. Patient had significant rash on abdomen that patient states seems to be spreading. RN noted rash on sides as well as on upper left thigh and under honeycomb dressing with scattered areas appearing almost like hives. RN applied hydrocortisone cream to affected areas and provided mom with prescribed antihistamine medication. Patient educated to not scratch areas and perform hand hygiene before handling infant after touching areas.

## 2018-05-05 NOTE — Lactation Note (Signed)
This note was copied from a baby's chart. Lactation Consultation Note  Patient Name: Grace Zhang [id number]'G Date: 05/05/2018 Reason for consult: Follow-up assessment;1st time breastfeeding;Primapara;Term;Infant weight loss  81 hours old FT female who is being exclusively BF by his mother, she's a P1. Baby is at 7% weight loss but parents are already supplementing with EBM. Mom was really proud when she told LC she was getting about 15-22 ml of breastmilk per pumping sessions, mom is pumping 4 times/day and following all previous LC's recommendations, per mom BF is going well and much better now; baby is able to latch on and feed at the breast.   Spend a good amount of time educating parents, mom wanted to know when baby will be weaning off the NS. Her RN brought 2 more NS into her room, she had a total of 3 already, but asked LC to take away the two unopened one when she found out she'll be charged for those. Reassured mom that her only BF supplies that are chargeable are the NS and the DEBP kit. LC also showed her how to convert her [employer] kit into a single user pump kit so she has a spare kit for home use.  Mom asked LC to check on NS size before she opened a new pack (RN brought another # 20 and and # 24) but # 20 seems appropriate at this time. Mom also asked LC to check on flange sizes, and the size # 24 seemed appropriate as today. However mom is aware that once her milk comes in full force if latching becomes uncomfortable, NS is too snug or flanges are too tight, she can go up a size; to the #27 on her flanges and a # 24 on the NS that she'll get on her own if she has to.  Mom showed LC all her nursing supplies, LC gave mom some extra milk storage containers since she's already pumping volume. Instructed mom to keep supplementing baby after feedings as she's been doing to help reverse weight loss; baby cluster fed last night.  Feeding plan:  1. Encouraged mom to feed baby STS 8-12  times/24 hours or sooner if feeding cues are present 2. Mom will continue pumping 4-6 times/24 hours and will continue supplementing baby with her EBM after feedings according the supplementation guidelines for baby's age in hours.  Parents reported all questions and concerns were answered, they're both aware of [place] services and will call PRN.  Maternal Data    Feeding    Interventions Interventions: Breast feeding basics reviewed;DEBP  Lactation Tools Discussed/Used Tools: Nipple [place] Fuel;Pump Nipple shield size: 20 Breast pump type: Double-Electric Breast Pump   Consult Status Consult Status: Follow-up Date: 05/06/18 Follow-up type: In-patient    [place] 05/05/2018, 8:06 PM

## 2018-05-06 ENCOUNTER — Encounter (HOSPITAL_COMMUNITY): Payer: Self-pay | Admitting: *Deleted

## 2018-05-06 MED ORDER — HYDROXYZINE HCL 25 MG PO TABS: 25.0000 mg | ORAL_TABLET | Freq: Three times a day (TID) | ORAL | 1 refills | Status: DC | PRN

## 2018-05-06 MED ORDER — HYDROCORTISONE 1 % EX CREA: TOPICAL_CREAM | Freq: Three times a day (TID) | CUTANEOUS | 1 refills | Status: DC

## 2018-05-06 MED ORDER — OXYCODONE HCL 5 MG PO TABS: 5.0000 mg | ORAL_TABLET | ORAL | 0 refills | Status: AC | PRN

## 2018-05-06 MED ORDER — IBUPROFEN 600 MG PO TABS: 600.0000 mg | ORAL_TABLET | Freq: Four times a day (QID) | ORAL | 1 refills | Status: DC | PRN

## 2018-05-06 NOTE — Progress Notes (Signed)
Subjective: Postpartum Day 3: Cesarean Delivery Patient reports tolerating PO, + flatus and no problems voiding.Rash still present but itching has improved significantly with vistaril and continued hydrocortisone cream. She is breastfeeding and bonding well with baby. Feels ready for discharge to home today    Objective: Vital signs in last 24 hours: Temp:  [97.5 F (36.4 C)-98.6 F (37 C)] 98.6 F (37 C) (01/04 0835) Pulse Rate:  [65-78] 78 (01/04 0835) Resp:  [18] 18 (01/04 0835) BP: (98-106)/(56-61) 98/61 (01/04 6837)  Physical Exam:  General: alert, cooperative and no distress Lochia: appropriate Uterine Fundus: firm Incision: no significant drainage DVT Evaluation: No evidence of DVT seen on physical exam.  Recent Labs    05/04/18 0535  HGB 11.1*  HCT 32.0*    Assessment/Plan: Status post Cesarean section. Doing well postoperatively.  Discharge home with standard precautions and return to clinic in 2 weeks for incision check and 6 weeks for postpartum visit  Delron Comer W Katti Pelle 05/06/2018, 10:52 AM

## 2018-05-06 NOTE — Discharge Instructions (Signed)
Call with any concerns [phone]

## 2018-05-06 NOTE — Discharge Summary (Signed)
OB Discharge Summary     Patient Name: Grace Zhang DOB: [birth year] MRN: [id number]  Date of admission: 05/03/2018 Delivering MD: Paula Compton   Date of discharge: 05/06/2018  Admitting diagnosis: breech Intrauterine pregnancy: [redacted]w[redacted]d     Secondary diagnosis:  Active Problems:   Breech presentation   S/P primary low transverse C-section  Additional problems: rash over abdomen - possible adhesive allergy     Discharge diagnosis: Term Pregnancy Delivered                                                                                                Post partum procedures:none  Augmentation: n/a  Complications: None  Hospital course:  Sceduled C/S   28 y.o. yo G1P0000 at [redacted]w[redacted]d was admitted to the hospital 05/03/2018 for scheduled cesarean section with the following indication:Malpresentation.  Membrane Rupture Time/Date: 8:38 AM ,05/03/2018   Patient delivered a Viable infant.05/03/2018  Details of operation can be found in separate operative note.  Pateint had an uncomplicated postpartum course.  She is ambulating, tolerating a regular diet, passing flatus, and urinating well. Patient is discharged home in stable condition on  05/06/18         Physical exam  Vitals:   05/05/18 0552 05/05/18 1334 05/05/18 2227 05/06/18 0835  BP: (!) 98/52 (!) 106/56 103/60 98/61  Pulse: 63 65 71 78  Resp: 16 18  18   Temp: 98.3 F (36.8 C) 98 F (36.7 C) (!) 97.5 F (36.4 C) 98.6 F (37 C)  TempSrc: Oral  Oral Oral  SpO2: 98%     Weight:      Height:       General: alert, cooperative and no distress Lochia: appropriate Uterine Fundus: firm; rash red, nontender, maculopapular Incision: Healing well with no significant drainage DVT Evaluation: No evidence of DVT seen on physical exam. Labs: Lab Results  Component Value Date   WBC 11.3 (H) 05/04/2018   HGB 11.1 (L) 05/04/2018   HCT 32.0 (L) 05/04/2018   MCV 97.6 05/04/2018   PLT 100 (L) 05/04/2018   No flowsheet data  found.  Discharge instruction: per After Visit Summary and "Baby and Me Booklet".  After visit meds:  Allergies as of 05/06/2018   No Known Allergies     Medication List    TAKE these medications   hydrocortisone cream 1 % Apply topically 3 (three) times daily.   hydrOXYzine 25 MG tablet Commonly known as:  ATARAX/VISTARIL Take 1 tablet (25 mg total) by mouth every 8 (eight) hours as needed for itching.   hypromellose 0.3 % Gel ophthalmic ointment Commonly known as:  GENTEAL Place 1 application into both eyes at bedtime.   ibuprofen 600 MG tablet Commonly known as:  ADVIL,MOTRIN Take 1 tablet (600 mg total) by mouth every 6 (six) hours as needed for cramping.   oxyCODONE 5 MG immediate release tablet Commonly known as:  Oxy IR/ROXICODONE Take 1 tablet (5 mg total) by mouth every 4 (four) hours as needed for up to 7 days for severe pain.   PRENATAL VITAMIN PO Take 1 tablet by mouth daily.  Diet: routine diet  Activity: Advance as tolerated. Pelvic rest for 6 weeks.   Outpatient follow up:Incision check in 2 weeks and postpartum visit in 6 weeks Follow up Appt:No future appointments. Follow up Visit:No follow-ups on file.  Postpartum contraception: Not Discussed  Newborn Data: Live born female  Birth Weight: 8 lb 7.8 oz (3850 g) APGAR: 89, 9  Newborn Delivery   Birth date/time:  05/03/2018 08:39:00 Delivery type:  C-Section, Low Vertical C-section categorization:  Primary     Baby Feeding: Breast Disposition:home with mother   05/06/2018 Isaiah Serge, DO

## 2018-05-06 NOTE — Lactation Note (Signed)
This note was copied from a baby's chart. Lactation Consultation Note  Patient Name: Grace Zhang INOMV'E Date: 05/06/2018 Reason for consult: Follow-up assessment;Primapara;1st time breastfeeding;Term;Infant weight loss  Baby is 3 hours old - for D/C  As Lc entered the room baby latched with depth and using #20 NS .  Per mom comfortable / baby fed for 20 mins  Mom denies soreness / milk is in bilaterally/  Sore nipple and engorgement prevention and tx reviewed  Mom has hand pump/ DEBP Medela Kit and a DEBP Medela at home.  Mom and dad aware of the [facility] plan with the use of NS and need for post pumping after  5-6 feedings and PRN.  Due to mom looking and expressing she is very tired/ LC recommended until baby is back  To birth weight / and has been seen for F/U LC visit - to feed 1st breast 15 -20 mins with #20 NS  30 mx and then supplement 30 ml of EBM and post pump both breast.  Discussed the importance for STS feedings until baby is back to birth weight/ gaining steadily/  And can stay awake for majority of feeding.  Next feeding switch to the other breast and do the same.  If there is feeding when mom is more rested / feed both breast / and not supplement / take a break  From pumping.  Per mom plans to F/U with LC in the [place] office at [facility] / this [place] recommended 4- 5 days.  LC praised mom for her breastfeeding efforts and dad for his support.  Mother informed of post-discharge support and given phone number to the lactation department, including services for phone call assistance; out-patient appointments; and breastfeeding support group. List of other breastfeeding resources in the community given in the handout. Encouraged mother to call for problems or concerns related to breastfeeding.   Maternal Data Has patient been taught Hand Expression?: Yes  Feeding Feeding Type: (baby latched on the right breast / football )  LATCH Score Latch: (latched with depth  )  Audible Swallowing: (multiple swallows / increased with breast compressions )  Type of Nipple: (nipple well rounded when baby released )  Comfort (Breast/Nipple): (milk in full / not engorged )  Hold (Positioning): (dad assisted )  LATCH Score: 9  Interventions Interventions: Breast feeding basics reviewed  Lactation Tools Discussed/Used Tools: Pump;Shells;Flanges Nipple shield size: 20 Flange Size: 24 Shell Type: Inverted Breast pump type: Double-Electric Breast Pump;Manual WIC Program: No   Consult Status Consult Status: Follow-up Date: (per mom plans to see the [facility] in [place] office ) Follow-up type: Out-patient    [place] 05/06/2018, 9:41 AM

## 2019-07-06 ENCOUNTER — Ambulatory Visit (HOSPITAL_COMMUNITY): Payer: BC Managed Care – PPO | Attending: Obstetrics and Gynecology | Admitting: Genetic Counselor

## 2019-07-06 ENCOUNTER — Other Ambulatory Visit: Payer: Self-pay

## 2019-07-06 DIAGNOSIS — Z148 Genetic carrier of other disease: Secondary | ICD-10-CM

## 2019-07-06 DIAGNOSIS — Z315 Encounter for genetic counseling: Secondary | ICD-10-CM

## 2019-07-06 NOTE — Progress Notes (Signed)
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 Grace Zhang presented to her appointment alone.  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  Family History  A three generation pedigree was drafted and reviewed. The family history is remarkable for the following:  - Grace Zhang has a maternal uncle who had some learning delays. He is a Programmer, systems, works a Architect, and is married. Many times, learning difficulties are multifactorial in nature, occurring due to a combination of genetic and environmental factors that are difficult to identify.  Identify. Learning difficulties can also be associated with genetic conditions such as fragile X syndrome. 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 See Discussion section for more details.  - Grace Zhang's husband Grace Zhang had Legg-Calve-Perthes disease that required treatment in childhood. Legg-Calve-Perthes disease is a childhood condition that occurs when blood supply to the femoral head of the hip joint is interrupted and the bone dies, fractures, and begins to renew itself. The cause of Legg-Calve-Perthes disease is unknown. Thus, risk assessment is limited. The couple has already informed their son's pediatrician of this history.  - Grace Zhang's mother, maternal aunt, and maternal grandmother all have hearing loss. We discussed that there are several possible  explanations for hearing loss, including genetic conditions or errors in embryonal/fetal development. The precise etiology for hearing loss in the family is unknown. However, literature indicates that up to 80% of congenital hearing loss is caused by genetic factors. Of that 80%, 20% is syndromic and 80% is nonsyndromic. There are various possible patterns of inheritance associated with hearing loss. If Grace Zhang's family members developed hearing loss later in adulthood, Grace Zhang could have up to a 50% chance of developing his hearing loss himself.  The remaining family histories were reviewed and found to be noncontributory for birth defects, intellectual disability, recurrent pregnancy loss, and known genetic conditions.    The patient's ethnicity is [place]. The father of the pregnancy's ethnicity is Caucasian. Ashkenazi Jewish ancestry and consanguinity were denied. Pedigree will be scanned under Media.  Discussion  Grace Zhang had carrier screening performed via the Essential Panel offered through the laboratory [facility]. 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 One normal sized CGG repeat (30 repeats) and an intermediate sized CGG repeat (45 repeats) in the FMR1 gene were detected.  Fragile X syndrome is the most common inherited cause of intellectual disability. Fragile X syndrome is caused by mutations in the FMR1 gene on the X chromosome. Nearly all cases of fragile X syndrome are caused by mutations in the CGG trinucleotide repeat region of the FMR1 gene. Typically, the FMR1 gene contains 6 to 44 CGG trinucleotide repeats. Individuals with this number of repeats are not affected by fragile X syndrome. Individuals with fragile X syndrome have over 200 CGG repeats. This abnormally expanded CGG trinucleotide repeat segment silences the FMR1 gene, which prevents the production of the FMRP protein. Loss or deficiency of this protein leads to  the symptoms associated with fragile  X syndrome, including mild to moderate intellectual disability, developmental delays, autism, behavioral abnormalities, and characteristic physical features. Fragile X syndrome usually affects males more severely than females, since males have one X chromosome and females have two.   Repeat sizes of 45-54 in the CGG trinucleotide segment of the FMR1 gene are considered to be intermediate sized repeats. Repeats of this size are also commonly referred to as the "gray zone". Individuals with repeat alleles that fall in the intermediate range are not at risk of having a child affected by fragile X syndrome, as an intermediate allele is not at risk of expanding to a full mutation (>200 repeats) in one generation. Individuals who have an allele that falls in the intermediate range do not experience any symptoms themselves.   Individuals with 55-200 CGG trinucleotide repeats in the FMR1 gene are said to be premutation carriers for fragile X syndrome. Individuals with this number of repeats are at risk for their children to inherit an expanded allele (>200 repeats) and potentially be affected by fragile X syndrome. Premutation carriers also may experience symptoms of their own, such as symptoms associated with fragile X-associated primary ovarian insufficiency (FXPOI) or fragile X-associated tremor/ataxia syndrome (FXTAS). FXPOI is characterized by reduced function of the ovaries. This can cause irregular menstrual cycles, early menopause, and infertility in females. FXTAS is characterized by problems with movement and cognition after age 7 that worsen with age. Features of FXTAS include intention tremor (trembling of a limb when performing voluntary movements) and progressive cerebellar ataxia (problems with coordination and balance). Males and female premutation carriers are both at risk for FXTAS, although males tend to be affected more often than females.   The largest known  repeat size to expand into a full mutation is 56 repeats, which falls in the premutation range. [name] largest repeat size of 45 falls in the low end of the intermediate range, indicating that she is not at increased risk to have a child affected by fragile X syndrome. However, it is possible that an intermediate allele may expand into a premutation in the next generation; thus, Ms. Whittier's children may be at risk to have children of their own who are affected by fragile X syndrome. Her children also may be at risk to develop FXTAS/FXPOI in the future if they are premutation carriers.  Ms. Chute had several thoughtful questions about her fragile X syndrome carrier screening results. Ms. Riopelle inquired whether any of her future daughters could theoretically inherit an altered X chromosome from her father should her partner be a premutation carrier. While it is theoretically possible that Ms. Lobb's partner could have a premutation, the couple's daughters would inherit the premutation on the X chromosome from their father, as expansion into full mutations have not been documented in cases of paternal inheritance. Ms. Lacewell also had questions about testing her children for their fragile X carrier status. We discussed that it is recommended that Ms. Eckford's children undergo preconception genetic counseling when they are of childbearing age. If they are interested, they can decide to undergo carrier screening for fragile X syndrome at that time to determine their risks of having an affected child as well as their risks of possibly experiencing FXPOI or FXTAS that are associated with premutation carrier status. Ms. Zill inquired if it would be possible to test her 51 month old son to determine if he has a premutation and thus if he is at risk to develop FXTAS later in life. We discussed the concept  of autonomy and protecting her son's right not to know. It is recommended that her son be allowed to  make the choice to pursue or not pursue testing once he is a consenting adult given that symptoms of FXTAS would not present until middle adulthood.   Ms. Sillah also inquired about whether or not her brothers should undergo carrier screening for fragile X syndrome. We discussed that her brothers certainly could meet with a genetic counselor to discuss what her test results mean for themselves. However, even if her mother is an intermediate carrier and passed on a premutation to her sons, Ms. Derringer's brothers would still not have a chance at having an affected son. This is because males will always pass on their Y chromosomes to their sons. If Ms. Gasior's brothers were premutation carriers, each of their daughters would be premutation carriers as well; these female children would then have a chance of having children affected by fragile X syndrome.  Following our session, I emailed Ms. Quentin Ore several resources. One resource was a link to an article about understanding the implications of a fragile X intermediate result from the [employer]. Another resource was a link to the Early Check website. Early Check is a research study that assesses for fragile X syndrome and Duchenne/Becker muscular dystrophy as a part of newborn screening in [place]. We discussed that if Ms. Lamberty was interested in participating in the Early Check study for future pregnancies, she would get information regarding her baby's fragile X carrier status shortly after birth. Finally, I included a link to the [employer]' ([place]) find a Soil scientist. Ms. Zorger brothers are both located in [place] and may use this tool to find genetic counseling services in their area should they be interested in pursuing this.  Ms. Cahoon reviewed this email, then sent a follow-up email inquiring about AGG testing. AGG repeats within the CGG repeat region in the FMR1 gene  influence the risk of expansion to a full mutation from parent to child. Ms. Rackow expressed interest in pursuing AGG analysis to determine the chances that her intermediate allele could expand into a premutation in her children. Unfortunately, laboratories do not offer AGG analysis alone. Instead, it must be ordered as a part of Fragile X carrier screening which she has already had done. Additionally, the labs that perform AGG analysis only perform it clinically for individuals identified as premutation carriers, not intermediate carriers. I informed Ms. Dorner of this information but provided her with some more specific data about the risks of expansion for individuals who are carriers of 45-49 repeats like herself. Based on data from Nolin et al. (2015 & 2013), 66% of repeats within the 45-90 CGG repeat range expand by one or more repeats in the next generation. However, of individuals with 45-49 CGG repeats who pass on an expanded allele to their children, the average repeat size difference between generations is 1-2 repeats, with a total range of 1-5 repeats noted. Based on this data and given that Ms. Donnell's repeat size is at the very beginning of the intermediate repeat range, even if she were to pass on an expanded CGG repeat to her children, it is likely that they will not tip into the premutation range and will instead remain intermediate carriers themselves. This is true regardless of how many AGG repeats Ms. Lapietra may or may not have.    I encouraged Ms. Quentin Ore to contact me if she has  any further follow-up questions. I can also write a patient letter for her summarizing our discussion if desired.  I counseled Ms. Quentin Ore regarding the above risks and available options. The approximate face-to-face time with the genetic counselor was 45 minutes.  In summary:  Discussed carrier screening results for fragile X syndrome  Intermediate carrierfor fragile X syndrome (doesnot increase risk  of havingan affected child)  Discussed implications for children  Children have slightly increased chance to be premutation carriers  Female premutation carriers at risk for FXTAS but not at risk for having affected children  Female premutation carriers at risk for FXPOI, FXTAS, and having affected children  Recommended that her children have preconception genetic counseling in adulthood  Discussed implications for other family members  Brothers not at risk of having affected children even if they are premutation carriers, but may pursue testing/genetic counseling to determine their risk for FXTAS  Fragile X carrier screening not recommended for partner given X-linked inheritance   Reviewed additional testing options  AGG testing not clinically available for intermediate carriers (premutation carriers only)  Early Check study would determine children's fragile X carrier status at birth  Reviewed family history concerns   Buelah Manis, MS, Counselling psychologist

## 2019-07-09 ENCOUNTER — Ambulatory Visit (HOSPITAL_COMMUNITY): Payer: BLUE CROSS/BLUE SHIELD

## 2019-07-11 ENCOUNTER — Encounter (HOSPITAL_COMMUNITY): Payer: BLUE CROSS/BLUE SHIELD

## 2019-07-16 ENCOUNTER — Encounter: Payer: Self-pay | Admitting: Certified Nurse Midwife

## 2019-07-18 ENCOUNTER — Ambulatory Visit (HOSPITAL_COMMUNITY): Payer: Self-pay | Admitting: Obstetrics and Gynecology

## 2019-07-18 ENCOUNTER — Encounter: Payer: Self-pay | Admitting: Certified Nurse Midwife

## 2019-07-19 ENCOUNTER — Other Ambulatory Visit (HOSPITAL_COMMUNITY): Payer: Self-pay | Admitting: Obstetrics and Gynecology

## 2020-06-03 ENCOUNTER — Encounter (HOSPITAL_COMMUNITY): Payer: Self-pay

## 2020-06-03 ENCOUNTER — Encounter (HOSPITAL_COMMUNITY): Payer: Self-pay | Admitting: *Deleted

## 2020-06-03 NOTE — Patient Instructions (Signed)
Baley Mollerus Bartram  06/03/2020   Your procedure is scheduled on:  06/09/2020  Arrive at [facility] at Entrance C on [address] at [facility]  and [address]. You are invited to use the FREE valet parking or use the Visitor's parking deck.  Pick up the phone at the desk and dial [phone].  Call this number if you have problems the morning of surgery: [phone]  Remember:   Do not eat food:(After Midnight) Desps de medianoche.  Do not drink clear liquids: (After Midnight) Desps de medianoche.  Take these medicines the morning of surgery with A SIP OF WATER:  none   Do not wear jewelry, make-up or nail polish.  Do not wear lotions, powders, or perfumes. Do not wear deodorant.  Do not shave 48 hours prior to surgery.  Do not bring valuables to the hospital.  [facility] is not   responsible for any belongings or valuables brought to the hospital.  Contacts, dentures or bridgework may not be worn into surgery.  Leave suitcase in the car. After surgery it may be brought to your room.  For patients admitted to the hospital, checkout time is 11:00 AM the day of              discharge.      Please read over the following fact sheets that you were given:     Preparing for Surgery

## 2020-06-06 ENCOUNTER — Encounter (HOSPITAL_COMMUNITY)
Admission: RE | Admit: 2020-06-06 | Discharge: 2020-06-06 | Disposition: A | Discharge status: Home / Self Care | Payer: BC Managed Care – PPO | Source: Ambulatory Visit | Attending: Obstetrics and Gynecology | Admitting: Obstetrics and Gynecology

## 2020-06-06 ENCOUNTER — Other Ambulatory Visit (HOSPITAL_COMMUNITY)
Admission: RE | Admit: 2020-06-06 | Discharge: 2020-06-06 | Disposition: A | Discharge status: Home / Self Care | Payer: BC Managed Care – PPO | Source: Ambulatory Visit | Attending: Obstetrics and Gynecology | Admitting: Obstetrics and Gynecology

## 2020-06-06 ENCOUNTER — Other Ambulatory Visit: Payer: Self-pay

## 2020-06-06 DIAGNOSIS — Z20822 Contact with and (suspected) exposure to covid-19: Secondary | ICD-10-CM | POA: Insufficient documentation

## 2020-06-06 DIAGNOSIS — Z01812 Encounter for preprocedural laboratory examination: Secondary | ICD-10-CM | POA: Insufficient documentation

## 2020-06-06 LAB — SARS CORONAVIRUS 2 (TAT 6-24 HRS): SARS Coronavirus 2: NEGATIVE

## 2020-06-06 LAB — CBC
HCT: 39.3 % (ref 36.0–46.0)
Hemoglobin: 13 g/dL (ref 12.0–15.0)
MCH: 31.8 pg (ref 26.0–34.0)
MCHC: 33.1 g/dL (ref 30.0–36.0)
MCV: 96.1 fL (ref 80.0–100.0)
Platelets: 115 10*3/uL — ABNORMAL LOW (ref 150–400)
RBC: 4.09 MIL/uL (ref 3.87–5.11)
RDW: 12.1 % (ref 11.5–15.5)
WBC: 8.1 10*3/uL (ref 4.0–10.5)
nRBC: 0 % (ref 0.0–0.2)

## 2020-06-06 LAB — TYPE AND SCREEN
ABO/RH(D): O POS
Antibody Screen: NEGATIVE

## 2020-06-07 LAB — RPR: RPR Ser Ql: NONREACTIVE

## 2020-06-08 ENCOUNTER — Encounter (HOSPITAL_COMMUNITY): Payer: Self-pay | Admitting: Obstetrics and Gynecology

## 2020-06-08 NOTE — H&P (Signed)
Grace Zhang is a 30 y.o. female presenting for scheduled repeat c-section at 21 + weeks  (EDD 06/14/20 by LMP c/w 9 week [place]) .    Prenatal care relatively uncomplicated.  She had a prior c-section for breech presentation and declines TOL.Marland Kitchen  She has had genetic counseling for being a fragile X intermediate allele carrier--baby would not be affected with syndrome, but could be a carrier. She is a GBS carrier. OB History    Gravida  2   Para  1   Term  0   Preterm  0   AB  0   Living  1     SAB  0   IAB  0   Ectopic  0   Multiple  0   Live Births            05-03-2018, 39.1 wks  M, 8lbs 7oz, Cesarean Section  Past Medical History:  Diagnosis Date  . Acne - sees dermatologist 11/08/2013  . Frequent headaches    Past Surgical History:  Procedure Laterality Date  . BUNIONECTOMY Right 10/14  . CESAREAN SECTION N/A 05/03/2018   Procedure: CESAREAN SECTION;  Surgeon: Paula Compton, MD;  Location: [place];  Service: Obstetrics;  Laterality: N/A;  MD Request RNFA  . EYE SURGERY    . REFRACTIVE SURGERY     Family History: family history includes Cancer in her maternal grandfather; Osteoporosis in her maternal grandmother; Stroke in her maternal grandfather; Thyroid disease in her mother. Social History:  reports that she has never smoked. She has never used smokeless tobacco. She reports previous alcohol use. She reports that she does not use drugs.     Maternal Diabetes: No Genetic Screening: Normal Maternal Ultrasounds/Referrals: Other: Isolated tiny echogenic foci in liver, not clinically significant  Fetal Ultrasounds or other Referrals:  None Maternal Substance Abuse:  No Significant Maternal Medications:  None Significant Maternal Lab Results:  Group B Strep positive Other Comments:  None  Review of Systems  Constitutional: Negative for fever.  Gastrointestinal: Negative for abdominal pain.   Maternal Medical History:  Contractions:  Frequency: rare.   Perceived severity is mild.    Fetal activity: Perceived fetal activity is normal.    Prenatal complications: Prior c-section  Prenatal Complications - Diabetes: none.      unknown if currently breastfeeding. Maternal Exam:  Uterine Assessment: Contraction frequency is rare.   Abdomen: Patient reports no abdominal tenderness. Surgical scars: low transverse.   Fetal presentation: vertex  Introitus: Normal vulva. Normal vagina.    Physical Exam Cardiovascular:     Rate and Rhythm: Normal rate and regular rhythm.  Pulmonary:     Effort: Pulmonary effort is normal.  Abdominal:     Palpations: Abdomen is soft.  Genitourinary:    General: Normal vulva.  Neurological:     Mental Status: She is alert.  Psychiatric:        Mood and Affect: Mood normal.     Prenatal labs: ABO, Rh: --/--/O POS (02/04 1093) Antibody: NEG (02/04 0944) Rubella:  Immune RPR: NON REACTIVE (02/04 0948)  HBsAg:   Neg HIV:   NR GBS:   positive One hour GCT 90 NIPT low risk CF and SMA carrier negative HgbAA  Assessment/Plan:  C-section procedure reviewed in detail with patient.  also reviewed risks and benefits including bleeding, infection and possible damage to bowel and bladder.  Pt desires to proceed.   Logan Bores 06/08/2020, 7:50 AM

## 2020-06-08 NOTE — Anesthesia Preprocedure Evaluation (Addendum)
Anesthesia Evaluation  Patient identified by MRN, date of birth, ID band Patient awake    Reviewed: Allergy & Precautions, NPO status , Patient's Chart, lab work & pertinent test results  Airway Mallampati: II  TM Distance: >3 FB Neck ROM: Full    Dental no notable dental hx. (+) Teeth Intact   Pulmonary neg pulmonary ROS,    Pulmonary exam normal breath sounds clear to auscultation       Cardiovascular negative cardio ROS Normal cardiovascular exam Rhythm:Regular Rate:Normal     Neuro/Psych  Headaches, Dry eyes negative psych ROS   GI/Hepatic negative GI ROS, Neg liver ROS,   Endo/Other  negative endocrine ROS  Renal/GU negative Renal ROS  negative genitourinary   Musculoskeletal negative musculoskeletal ROS (+)   Abdominal   Peds  Hematology Thrombocytopenia   Anesthesia Other Findings   Reproductive/Obstetrics (+) Pregnancy Previous C/Section x 1- breech                            Anesthesia Physical Anesthesia Plan  ASA: II  Anesthesia Plan: Spinal   Post-op Pain Management:    Induction:   PONV Risk Score and Plan: 4 or greater and Treatment may vary due to age or medical condition  Airway Management Planned: Natural Airway  Additional Equipment:   Intra-op Plan:   Post-operative Plan:   Informed Consent: I have reviewed the patients History and Physical, chart, labs and discussed the procedure including the risks, benefits and alternatives for the proposed anesthesia with the patient or authorized representative who has indicated his/her understanding and acceptance.     Dental advisory given  Plan Discussed with: CRNA and Anesthesiologist  Anesthesia Plan Comments:        Anesthesia Quick Evaluation

## 2020-06-09 ENCOUNTER — Inpatient Hospital Stay (HOSPITAL_COMMUNITY): Payer: BC Managed Care – PPO | Admitting: Anesthesiology

## 2020-06-09 ENCOUNTER — Inpatient Hospital Stay (HOSPITAL_COMMUNITY): Admit: 2020-06-09 | Payer: Self-pay

## 2020-06-09 ENCOUNTER — Encounter (HOSPITAL_COMMUNITY): Payer: Self-pay | Admitting: Obstetrics and Gynecology

## 2020-06-09 ENCOUNTER — Encounter (HOSPITAL_COMMUNITY)
Admission: RE | Disposition: A | Discharge status: Home / Self Care | Payer: Self-pay | Source: Home / Self Care | Attending: Obstetrics and Gynecology

## 2020-06-09 ENCOUNTER — Inpatient Hospital Stay (HOSPITAL_COMMUNITY)
Admission: RE | Admit: 2020-06-09 | Discharge: 2020-06-11 | DRG: 787 | Disposition: A | Discharge status: Home / Self Care | Payer: BC Managed Care – PPO | Attending: Obstetrics and Gynecology | Admitting: Obstetrics and Gynecology

## 2020-06-09 ENCOUNTER — Other Ambulatory Visit: Payer: Self-pay

## 2020-06-09 DIAGNOSIS — O99824 Streptococcus B carrier state complicating childbirth: Secondary | ICD-10-CM | POA: Diagnosis present

## 2020-06-09 DIAGNOSIS — Z20822 Contact with and (suspected) exposure to covid-19: Secondary | ICD-10-CM | POA: Diagnosis present

## 2020-06-09 DIAGNOSIS — Z98891 History of uterine scar from previous surgery: Secondary | ICD-10-CM

## 2020-06-09 DIAGNOSIS — O9081 Anemia of the puerperium: Secondary | ICD-10-CM | POA: Diagnosis not present

## 2020-06-09 DIAGNOSIS — Z3A39 39 weeks gestation of pregnancy: Secondary | ICD-10-CM

## 2020-06-09 DIAGNOSIS — D62 Acute posthemorrhagic anemia: Secondary | ICD-10-CM | POA: Diagnosis not present

## 2020-06-09 DIAGNOSIS — O34211 Maternal care for low transverse scar from previous cesarean delivery: Principal | ICD-10-CM | POA: Diagnosis present

## 2020-06-09 HISTORY — DX: History of uterine scar from previous surgery: Z98.891

## 2020-06-09 SURGERY — Surgical Case
Anesthesia: Spinal

## 2020-06-09 MED ORDER — PHENYLEPHRINE HCL-NACL 20-0.9 MG/250ML-% IV SOLN
INTRAVENOUS | Status: AC
  Filled 2020-06-09: qty 250

## 2020-06-09 MED ORDER — WITCH HAZEL-GLYCERIN EX PADS: 1.0000 "application " | MEDICATED_PAD | CUTANEOUS | Status: DC | PRN

## 2020-06-09 MED ORDER — SOD CITRATE-CITRIC ACID 500-334 MG/5ML PO SOLN
ORAL | Status: AC
  Filled 2020-06-09: qty 30

## 2020-06-09 MED ORDER — SIMETHICONE 80 MG PO CHEW
80.0000 mg | CHEWABLE_TABLET | Freq: Three times a day (TID) | ORAL | Status: DC
  Administered 2020-06-09 – 2020-06-11 (×6): 80 mg via ORAL
  Filled 2020-06-09 (×6): qty 1

## 2020-06-09 MED ORDER — NALBUPHINE HCL 10 MG/ML IJ SOLN: 5.0000 mg | INTRAMUSCULAR | Status: DC | PRN

## 2020-06-09 MED ORDER — MORPHINE SULFATE (PF) 0.5 MG/ML IJ SOLN
INTRAMUSCULAR | Status: DC | PRN
  Administered 2020-06-09: .15 mg via INTRATHECAL

## 2020-06-09 MED ORDER — OXYTOCIN-SODIUM CHLORIDE 30-0.9 UT/500ML-% IV SOLN
INTRAVENOUS | Status: DC | PRN
  Administered 2020-06-09: 300 mL via INTRAVENOUS

## 2020-06-09 MED ORDER — COCONUT OIL OIL
1.0000 "application " | TOPICAL_OIL | Status: DC | PRN
  Administered 2020-06-09: 1 via TOPICAL

## 2020-06-09 MED ORDER — PHENYLEPHRINE HCL-NACL 20-0.9 MG/250ML-% IV SOLN
INTRAVENOUS | Status: DC | PRN
  Administered 2020-06-09: 60 ug/min via INTRAVENOUS

## 2020-06-09 MED ORDER — CEFAZOLIN SODIUM-DEXTROSE 2-4 GM/100ML-% IV SOLN
2.0000 g | INTRAVENOUS | Status: AC
  Administered 2020-06-09: 2 g via INTRAVENOUS

## 2020-06-09 MED ORDER — FENTANYL CITRATE (PF) 100 MCG/2ML IJ SOLN
INTRAMUSCULAR | Status: AC
  Filled 2020-06-09: qty 2

## 2020-06-09 MED ORDER — DIPHENHYDRAMINE HCL 25 MG PO CAPS
25.0000 mg | ORAL_CAPSULE | Freq: Four times a day (QID) | ORAL | Status: DC | PRN
  Administered 2020-06-09: 25 mg via ORAL
  Filled 2020-06-09: qty 1

## 2020-06-09 MED ORDER — DIPHENHYDRAMINE HCL 50 MG/ML IJ SOLN: 12.5000 mg | INTRAMUSCULAR | Status: DC | PRN

## 2020-06-09 MED ORDER — ONDANSETRON HCL 4 MG/2ML IJ SOLN
INTRAMUSCULAR | Status: DC | PRN
  Administered 2020-06-09: 4 mg via INTRAVENOUS

## 2020-06-09 MED ORDER — NALOXONE HCL 0.4 MG/ML IJ SOLN: 0.4000 mg | INTRAMUSCULAR | Status: DC | PRN

## 2020-06-09 MED ORDER — TETANUS-DIPHTH-ACELL PERTUSSIS 5-2.5-18.5 LF-MCG/0.5 IM SUSY: 0.5000 mL | PREFILLED_SYRINGE | Freq: Once | INTRAMUSCULAR | Status: DC

## 2020-06-09 MED ORDER — SENNOSIDES-DOCUSATE SODIUM 8.6-50 MG PO TABS
2.0000 | ORAL_TABLET | Freq: Every day | ORAL | Status: DC
  Administered 2020-06-10 (×2): 1 via ORAL
  Administered 2020-06-11: 2 via ORAL
  Filled 2020-06-09 (×2): qty 2

## 2020-06-09 MED ORDER — LACTATED RINGERS IV SOLN: INTRAVENOUS | Status: DC

## 2020-06-09 MED ORDER — ONDANSETRON HCL 4 MG/2ML IJ SOLN
4.0000 mg | Freq: Three times a day (TID) | INTRAMUSCULAR | Status: DC | PRN
Start: 2020-06-09 — End: 2020-06-11

## 2020-06-09 MED ORDER — OXYTOCIN-SODIUM CHLORIDE 30-0.9 UT/500ML-% IV SOLN: 2.5000 [IU]/h | INTRAVENOUS | Status: AC

## 2020-06-09 MED ORDER — FENTANYL CITRATE (PF) 100 MCG/2ML IJ SOLN
INTRAMUSCULAR | Status: DC | PRN
  Administered 2020-06-09: 15 ug via INTRATHECAL

## 2020-06-09 MED ORDER — OXYCODONE HCL 5 MG PO TABS
5.0000 mg | ORAL_TABLET | ORAL | Status: DC | PRN
  Administered 2020-06-09 – 2020-06-11 (×3): 5 mg via ORAL
  Filled 2020-06-09 (×3): qty 1

## 2020-06-09 MED ORDER — BUPIVACAINE IN DEXTROSE 0.75-8.25 % IT SOLN
INTRATHECAL | Status: DC | PRN
  Administered 2020-06-09: 1.6 mL via INTRATHECAL

## 2020-06-09 MED ORDER — SCOPOLAMINE 1 MG/3DAYS TD PT72
1.0000 | MEDICATED_PATCH | TRANSDERMAL | Status: DC
  Administered 2020-06-09: 1.5 mg via TRANSDERMAL

## 2020-06-09 MED ORDER — NALOXONE HCL 4 MG/10ML IJ SOLN
1.0000 ug/kg/h | INTRAMUSCULAR | Status: DC | PRN
  Filled 2020-06-09: qty 5

## 2020-06-09 MED ORDER — DIBUCAINE (PERIANAL) 1 % EX OINT: 1.0000 "application " | TOPICAL_OINTMENT | CUTANEOUS | Status: DC | PRN

## 2020-06-09 MED ORDER — SODIUM CHLORIDE 0.9% FLUSH: 3.0000 mL | INTRAVENOUS | Status: DC | PRN

## 2020-06-09 MED ORDER — ACETAMINOPHEN 500 MG PO TABS
1000.0000 mg | ORAL_TABLET | Freq: Four times a day (QID) | ORAL | Status: DC
  Administered 2020-06-09 – 2020-06-11 (×8): 1000 mg via ORAL
  Filled 2020-06-09 (×10): qty 2

## 2020-06-09 MED ORDER — SIMETHICONE 80 MG PO CHEW: 80.0000 mg | CHEWABLE_TABLET | ORAL | Status: DC | PRN

## 2020-06-09 MED ORDER — ZOLPIDEM TARTRATE 5 MG PO TABS: 5.0000 mg | ORAL_TABLET | Freq: Every evening | ORAL | Status: DC | PRN

## 2020-06-09 MED ORDER — DEXAMETHASONE SODIUM PHOSPHATE 10 MG/ML IJ SOLN
INTRAMUSCULAR | Status: DC | PRN
  Administered 2020-06-09: 10 mg via INTRAVENOUS

## 2020-06-09 MED ORDER — MORPHINE SULFATE (PF) 0.5 MG/ML IJ SOLN
INTRAMUSCULAR | Status: AC
  Filled 2020-06-09: qty 10

## 2020-06-09 MED ORDER — SCOPOLAMINE 1 MG/3DAYS TD PT72
MEDICATED_PATCH | TRANSDERMAL | Status: AC
  Filled 2020-06-09: qty 1

## 2020-06-09 MED ORDER — NALBUPHINE HCL 10 MG/ML IJ SOLN: 5.0000 mg | Freq: Once | INTRAMUSCULAR | Status: DC | PRN

## 2020-06-09 MED ORDER — STERILE WATER FOR IRRIGATION IR SOLN
Status: DC | PRN
  Administered 2020-06-09: 1000 mL

## 2020-06-09 MED ORDER — ONDANSETRON HCL 4 MG/2ML IJ SOLN
INTRAMUSCULAR | Status: AC
  Filled 2020-06-09: qty 2

## 2020-06-09 MED ORDER — DEXAMETHASONE SODIUM PHOSPHATE 10 MG/ML IJ SOLN
INTRAMUSCULAR | Status: AC
  Filled 2020-06-09: qty 1

## 2020-06-09 MED ORDER — IBUPROFEN 800 MG PO TABS
800.0000 mg | ORAL_TABLET | Freq: Three times a day (TID) | ORAL | Status: DC
  Administered 2020-06-09 – 2020-06-11 (×6): 800 mg via ORAL
  Filled 2020-06-09 (×8): qty 1

## 2020-06-09 MED ORDER — POVIDONE-IODINE 10 % EX SWAB
2.0000 "application " | Freq: Once | CUTANEOUS | Status: AC
  Administered 2020-06-09: 2 via TOPICAL

## 2020-06-09 MED ORDER — SODIUM CHLORIDE 0.9 % IR SOLN
Status: DC | PRN
  Administered 2020-06-09: 1

## 2020-06-09 MED ORDER — FENTANYL CITRATE (PF) 100 MCG/2ML IJ SOLN: 25.0000 ug | INTRAMUSCULAR | Status: DC | PRN

## 2020-06-09 MED ORDER — MENTHOL 3 MG MT LOZG: 1.0000 | LOZENGE | OROMUCOSAL | Status: DC | PRN

## 2020-06-09 MED ORDER — NALBUPHINE HCL 10 MG/ML IJ SOLN
5.0000 mg | Freq: Once | INTRAMUSCULAR | Status: DC | PRN
Start: 2020-06-09 — End: 2020-06-11

## 2020-06-09 MED ORDER — PRENATAL MULTIVITAMIN CH
1.0000 | ORAL_TABLET | Freq: Every day | ORAL | Status: DC
  Administered 2020-06-10 – 2020-06-11 (×2): 1 via ORAL
  Filled 2020-06-09: qty 1

## 2020-06-09 MED ORDER — DIPHENHYDRAMINE HCL 25 MG PO CAPS: 25.0000 mg | ORAL_CAPSULE | ORAL | Status: DC | PRN

## 2020-06-09 MED ORDER — NALBUPHINE HCL 10 MG/ML IJ SOLN
5.0000 mg | INTRAMUSCULAR | Status: DC | PRN
  Administered 2020-06-09: 5 mg via INTRAVENOUS
  Filled 2020-06-09: qty 1

## 2020-06-09 MED ORDER — OXYTOCIN-SODIUM CHLORIDE 30-0.9 UT/500ML-% IV SOLN
INTRAVENOUS | Status: AC
  Filled 2020-06-09: qty 500

## 2020-06-09 MED ORDER — CEFAZOLIN SODIUM-DEXTROSE 2-4 GM/100ML-% IV SOLN
INTRAVENOUS | Status: AC
  Filled 2020-06-09: qty 100

## 2020-06-09 SURGICAL SUPPLY — 37 items
BENZOIN TINCTURE PRP APPL 2/3 (GAUZE/BANDAGES/DRESSINGS) ×2 IMPLANT
CHLORAPREP W/TINT 26ML (MISCELLANEOUS) ×2 IMPLANT
CLAMP CORD UMBIL (MISCELLANEOUS) IMPLANT
CLOSURE STERI STRIP 1/2 X4 (GAUZE/BANDAGES/DRESSINGS) ×2 IMPLANT
CLOTH BEACON ORANGE TIMEOUT ST (SAFETY) ×2 IMPLANT
DRSG OPSITE POSTOP 4X10 (GAUZE/BANDAGES/DRESSINGS) ×2 IMPLANT
ELECT REM PT RETURN 9FT ADLT (ELECTROSURGICAL) ×2
ELECTRODE REM PT RTRN 9FT ADLT (ELECTROSURGICAL) ×1 IMPLANT
EXTRACTOR VACUUM KIWI (MISCELLANEOUS) IMPLANT
GLOVE BIO SURGEON STRL SZ 6.5 (GLOVE) ×2 IMPLANT
GLOVE BIOGEL PI IND STRL 7.0 (GLOVE) ×1 IMPLANT
GLOVE BIOGEL PI INDICATOR 7.0 (GLOVE) ×1
GOWN STRL REUS W/TWL LRG LVL3 (GOWN DISPOSABLE) ×4 IMPLANT
KIT ABG SYR 3ML LUER SLIP (SYRINGE) IMPLANT
NEEDLE HYPO 25X5/8 SAFETYGLIDE (NEEDLE) IMPLANT
NS IRRIG 1000ML POUR BTL (IV SOLUTION) ×2 IMPLANT
PACK C SECTION WH (CUSTOM PROCEDURE TRAY) ×2 IMPLANT
PAD OB MATERNITY 4.3X12.25 (PERSONAL CARE ITEMS) ×2 IMPLANT
PENCIL SMOKE EVAC W/HOLSTER (ELECTROSURGICAL) ×2 IMPLANT
RTRCTR C-SECT PINK 25CM LRG (MISCELLANEOUS) ×2 IMPLANT
STRIP CLOSURE SKIN 1/2X4 (GAUZE/BANDAGES/DRESSINGS) IMPLANT
SUT CHROMIC 1 CTX 36 (SUTURE) ×4 IMPLANT
SUT PLAIN 0 NONE (SUTURE) IMPLANT
SUT PLAIN 2 0 XLH (SUTURE) ×2 IMPLANT
SUT VIC AB 0 CT1 27 (SUTURE) ×2
SUT VIC AB 0 CT1 27XBRD ANBCTR (SUTURE) ×2 IMPLANT
SUT VIC AB 0 CT1 36 (SUTURE) ×2 IMPLANT
SUT VIC AB 2-0 CT1 27 (SUTURE) ×1
SUT VIC AB 2-0 CT1 TAPERPNT 27 (SUTURE) ×1 IMPLANT
SUT VIC AB 3-0 CT1 27 (SUTURE)
SUT VIC AB 3-0 CT1 TAPERPNT 27 (SUTURE) IMPLANT
SUT VIC AB 3-0 SH 27 (SUTURE) ×1
SUT VIC AB 3-0 SH 27X BRD (SUTURE) ×1 IMPLANT
SUT VIC AB 4-0 KS 27 (SUTURE) ×2 IMPLANT
TOWEL OR 17X24 6PK STRL BLUE (TOWEL DISPOSABLE) ×2 IMPLANT
TRAY FOLEY W/BAG SLVR 14FR LF (SET/KITS/TRAYS/PACK) ×2 IMPLANT
WATER STERILE IRR 1000ML POUR (IV SOLUTION) ×2 IMPLANT

## 2020-06-09 NOTE — Anesthesia Procedure Notes (Signed)
Spinal  Patient location during procedure: OR Start time: 06/09/2020 7:17 AM End time: 06/09/2020 7:20 AM Staffing Performed: anesthesiologist  Anesthesiologist: Josephine Igo, MD Preanesthetic Checklist Completed: patient identified, IV checked, site marked, risks and benefits discussed, surgical consent, monitors and equipment checked, pre-op evaluation and timeout performed Spinal Block Patient position: sitting Prep: DuraPrep and site prepped and draped Patient monitoring: heart rate, cardiac monitor, continuous pulse ox and blood pressure Approach: midline Location: L3-4 Injection technique: single-shot Needle Needle type: Pencan  Needle gauge: 24 G Needle length: 9 cm Needle insertion depth: 4.5 cm Assessment Sensory level: T4 Additional Notes Patient tolerated procedure well. Adequate sensory level.;

## 2020-06-09 NOTE — Interval H&P Note (Signed)
History and Physical Interval Note:  06/09/2020 8:53 AM  Grace Zhang  has presented today for surgery, with the diagnosis of repeat c-section.  The various methods of treatment have been discussed with the patient and family. After consideration of risks, benefits and other options for treatment, the patient has consented to  Procedure(s) with comments: Repeat CESAREAN SECTION (N/A) - request RNFA if one by then as a surgical intervention.  The patient's history has been reviewed, patient examined, no change in status, stable for surgery.  I have reviewed the patient's chart and labs.  Questions were answered to the patient's satisfaction.     Logan Bores

## 2020-06-09 NOTE — Op Note (Signed)
Operative Note    Preoperative Diagnosis Term pregnancy at 39+ weeks Prior c-section, declines TOL  Postoperative Diagnosis Same  Procedure Repeat low transverse c-section with 2 layer closure of uterus  Surgeon Paula Compton, MD  Anesthesia Spinal  Fluids: EBL 612mL UOP 539mL clear IVF 2300LR  Findings A viable female infant in the vertex presentation. Apgars 10,10 Weight pending Normal uterus tubes and ovaries.  Specimen Placenta to L&D  Procedure Note Patient was taken to the operating room where spinal anesthesia was obtained and found to be adequate by Allis clamp test. She was prepped and draped in the normal sterile fashion in the dorsal supine position with a leftward tilt. An appropriate time out was performed. A Pfannenstiel skin incision was then made through a pre-existing scar with the scalpel and carried through to the underlying layer of fascia by sharp dissection and Bovie cautery. The fascia was nicked in the midline and the incision was extended laterally with Mayo scissors. The inferior aspect of the incision was grasped Coker clamps and dissected off the underlying rectus muscles. In a similar fashion the superior aspect was dissected off the rectus muscles. Rectus muscles were separated in the midline and the peritoneal cavity entered bluntly. The peritoneal incision was then extended both superiorly and inferiorly with careful attention to avoid both bowel and bladder. The Alexis self-retaining wound retractor was then placed within the incision and the lower uterine segment exposed. The bladder flap was developed with Metzenbaum scissors and pushed away from the lower uterine segment. The lower uterine segment was then incised in a transverse fashion and the cavity itself entered bluntly. The incision was extended bluntly. The infant's head was then lifted and delivered from the incision without difficulty. The remainder of the infant delivered and the nose  and mouth bulb suctioned with the cord clamped and cut as well. The infant was handed off to the waiting pediatricians. The placenta was then spontaneously expressed from the uterus and the uterus cleared of all clots and debris with moist lap sponge. The uterine incision was then repaired in 2 layers the first layer was a running locked layer of 1-0 chromic and the second an imbricating layer of the same suture. The tubes and ovaries were inspected and the gutters cleared of all clots and debris. The uterine incision was inspected and found to be hemostatic. All instruments and sponges as well as the Alexis retractor were then removed from the abdomen. The rectus muscles and peritoneum were then reapproximated with a running suture of 2-0 Vicryl. The fascia was then closed with 0 Vicryl in a running fashion. Subcutaneous tissue was reapproximated with 3-0 plain in a running fashion. The skin was closed with a subcuticular stitch of 4-0 Vicryl on a Keith needle and then reinforced with benzoin and Steri-Strips. At the conclusion of the procedure all instruments and sponge counts were correct. Patient was taken to the recovery room in good condition with her baby accompanying her skin to skin.

## 2020-06-09 NOTE — Lactation Note (Signed)
This note was copied from a baby's chart. Lactation Consultation Note  Patient Name: Grace Zhang [id number] Date: 06/09/2020 Reason for consult: Initial assessment;Term Age:32 hours   P2 mother whose infant is now 74 hours old.  This is a term baby at 39+2 weeks.  Mother breast fed her first child (now 54 years old) for 15 months.  Mother had baby clothed and latched when I arrived.  Baby was sleepy at the breast.  Reviewed basic breast feeding concepts and suggested mother feed STS.  Mother pulled back blanket to help awaken baby.  Observed her feeding with a wide gape and flanged lips, however, she was sleepy.  Demonstrated gentle stimulation to help keep baby awake and actively feeding.  With stimulation baby continued to feed.  Discussed feeding cues and how to know when she is finished feeding.  Mother appreciative of help.  She will continue to feed 8-12 times/24 hours or sooner if baby shows cues.  Mother will call for latch assistance as needed.  Suggested mother continue to practice hand expression before/after feedings to stimulate breasts.  She will feed back any EBM she obtains to baby.    Mom made aware of O/P services, breastfeeding support groups, community resources, and our phone # for post-discharge questions. Mother has a DEBP for home use.  Grandmother present.   Maternal Data Has patient been taught Hand Expression?: Yes Does the patient have breastfeeding experience prior to this delivery?: Yes How long did the patient breastfeed?: 15 months  Feeding Mother's Current Feeding Choice: Breast Milk  LATCH Score Latch: Grasps breast easily, tongue down, lips flanged, rhythmical sucking.  Audible Swallowing: A few with stimulation  Type of Nipple: Everted at rest and after stimulation  Comfort (Breast/Nipple): Soft / non-tender  Hold (Positioning): Assistance needed to correctly position infant at breast and maintain latch.  LATCH Score: 8   Lactation Tools  Discussed/Used    Interventions Interventions: Breast feeding basics reviewed;Assisted with latch;Skin to skin;Breast massage;Hand express;Breast compression;Adjust position;Position options;Support pillows;Education  Discharge Pump: Personal WIC Program: No  Consult Status Consult Status: Follow-up Date: 06/10/20 Follow-up type: In-patient    [name] 06/09/2020, 3:18 PM

## 2020-06-09 NOTE — Anesthesia Postprocedure Evaluation (Signed)
Anesthesia Post Note  Patient: Grace Zhang  Procedure(s) Performed: Repeat CESAREAN SECTION (N/A )     Patient location during evaluation: PACU Anesthesia Type: Spinal Level of consciousness: oriented and awake and alert Pain management: pain level controlled Vital Signs Assessment: post-procedure vital signs reviewed and stable Respiratory status: spontaneous breathing, respiratory function stable and nonlabored ventilation Cardiovascular status: blood pressure returned to baseline and stable Postop Assessment: no headache, no backache, no apparent nausea or vomiting, spinal receding and patient able to bend at knees Anesthetic complications: no   No complications documented.  Last Vitals:  Vitals:   06/09/20 0930 06/09/20 0946  BP: 106/81 (!) 105/59  Pulse: 78 65  Resp: (!) 21   Temp:  36.7 C  SpO2: 99% 98%    Last Pain:  Vitals:   06/09/20 0946  TempSrc: Oral  PainSc:    Pain Goal: Patients Stated Pain Goal: 1 (06/09/20 0603)  LLE Motor Response: Purposeful movement (06/09/20 0930)   RLE Motor Response: Purposeful movement (06/09/20 0930)       Epidural/Spinal Function Cutaneous sensation: Able to Discern Pressure (06/09/20 0930), Patient able to flex knees: Yes (06/09/20 0930), Patient able to lift hips off bed: No (06/09/20 0930), Back pain beyond tenderness at insertion site: No (06/09/20 0930), Progressively worsening motor and/or sensory loss: No (06/09/20 0930), Bowel and/or bladder incontinence post epidural: No (06/09/20 0930)  Enrika Aguado A.

## 2020-06-09 NOTE — Addendum Note (Signed)
Addendum  created 06/09/20 1040 by Josephine Igo, MD   Order list changed, Order sets accessed, Pharmacy for encounter modified

## 2020-06-09 NOTE — Transfer of Care (Signed)
Immediate Anesthesia Transfer of Care Note  Patient: Grace Zhang  Procedure(s) Performed: Repeat CESAREAN SECTION (N/A )  Patient Location: PACU  Anesthesia Type:Spinal  Level of Consciousness: awake  Airway & Oxygen Therapy: Patient Spontanous Breathing  Post-op Assessment: Report given to RN  Post vital signs: Reviewed and stable  Last Vitals:  Vitals Value Taken Time  BP    Temp    Pulse    Resp    SpO2      Last Pain:  Vitals:   06/09/20 0603  TempSrc: Oral  PainSc: 3       Patients Stated Pain Goal: 1 (36/62/94 7654)  Complications: No complications documented.

## 2020-06-10 LAB — CBC
HCT: 26.8 % — ABNORMAL LOW (ref 36.0–46.0)
Hemoglobin: 8.9 g/dL — ABNORMAL LOW (ref 12.0–15.0)
MCH: 32.5 pg (ref 26.0–34.0)
MCHC: 33.2 g/dL (ref 30.0–36.0)
MCV: 97.8 fL (ref 80.0–100.0)
Platelets: 100 10*3/uL — ABNORMAL LOW (ref 150–400)
RBC: 2.74 MIL/uL — ABNORMAL LOW (ref 3.87–5.11)
RDW: 12.2 % (ref 11.5–15.5)
WBC: 12.3 10*3/uL — ABNORMAL HIGH (ref 4.0–10.5)
nRBC: 0 % (ref 0.0–0.2)

## 2020-06-10 NOTE — Progress Notes (Signed)
Subjective: Postpartum Day 1: Cesarean Delivery Patient reports tolerating PO and no problems voiding.  Pain-controlled.  Pt states passed several clots yesterday evening but a bit better today thus far.  Ambulating fine  Objective: Vital signs in last 24 hours: Temp:  [97.6 F (36.4 C)-98.4 F (36.9 C)] 97.7 F (36.5 C) (02/08 0547) Pulse Rate:  [58-78] 58 (02/08 0547) Resp:  [16-21] 16 (02/08 0547) BP: (94-112)/(47-81) 94/62 (02/08 0547) SpO2:  [97 %-99 %] 97 % (02/07 1809)  Physical Exam:  General: alert and cooperative Lochia: appropriate Uterine Fundus: firm, below umbilicus Incision: C/D/I   Recent Labs    06/10/20 0509  HGB 8.9*  HCT 26.8*    Assessment/Plan: Status post Cesarean section. Slightly heavier than normal lochia but seems improved.  Likely from uterine atony from larger baby but seems firm this AM.  Will monitor and if persists, give TXA. Platelets stable at 100K, will recheck that and Hgb in AM.  If Hgb drops further will give venofer.   Grace Zhang 06/10/2020, 9:15 AM

## 2020-06-11 LAB — CBC
HCT: 24.9 % — ABNORMAL LOW (ref 36.0–46.0)
Hemoglobin: 8.6 g/dL — ABNORMAL LOW (ref 12.0–15.0)
MCH: 33.1 pg (ref 26.0–34.0)
MCHC: 34.5 g/dL (ref 30.0–36.0)
MCV: 95.8 fL (ref 80.0–100.0)
Platelets: 103 10*3/uL — ABNORMAL LOW (ref 150–400)
RBC: 2.6 MIL/uL — ABNORMAL LOW (ref 3.87–5.11)
RDW: 12.4 % (ref 11.5–15.5)
WBC: 8.8 10*3/uL (ref 4.0–10.5)
nRBC: 0 % (ref 0.0–0.2)

## 2020-06-11 MED ORDER — IBUPROFEN 800 MG PO TABS: 800.0000 mg | ORAL_TABLET | Freq: Three times a day (TID) | ORAL | 1 refills | Status: DC | PRN

## 2020-06-11 MED ORDER — FERROUS SULFATE IRON 200 (65 FE) MG PO TABS: 1.0000 | ORAL_TABLET | Freq: Every day | ORAL | 1 refills | Status: DC

## 2020-06-11 NOTE — Discharge Instructions (Signed)
Call office with any concerns [phone] 

## 2020-06-11 NOTE — Progress Notes (Signed)
Subjective: Postpartum Day 2: Cesarean Delivery Patient reports tolerating PO, + flatus and no problems voiding.  Ambulating well; showered with no issues. Denies lighthededness, CP, SOB. She has not had a BM yet. Bonding well with baby Benjamine Mola.  She is considering discharge home today  Objective: Vital signs in last 24 hours: Temp:  [97.8 F (36.6 C)-98.4 F (36.9 C)] 98.1 F (36.7 C) (02/09 0630) Pulse Rate:  [61-79] 75 (02/09 0630) Resp:  [15-17] 17 (02/09 0630) BP: (90-107)/(53-57) 95/57 (02/09 0630) SpO2:  [96 %-100 %] 96 % (02/09 0630)  Physical Exam:  General: alert, cooperative and no distress Lochia: appropriate Uterine Fundus: firm Incision: no significant drainage DVT Evaluation: No evidence of DVT seen on physical exam. No significant calf/ankle edema.  Recent Labs    06/10/20 0509 06/11/20 0641  HGB 8.9* 8.6*  HCT 26.8* 24.9*    Assessment/Plan: Status post Cesarean section. Doing well postoperatively.  Continue current care Discharge home later today if so desires; instructions reviewed.  Isaiah Serge 06/11/2020, 10:30 AM

## 2020-06-11 NOTE — Discharge Summary (Signed)
Postpartum Discharge Summary  Date of Service updated      Patient Name: Grace Zhang DOB: 1991-03-03 MRN: [id number]  Date of admission: 06/09/2020 Delivery date:06/09/2020  Delivering provider: Paula Compton  Date of discharge: 06/11/2020  Admitting diagnosis: S/P repeat low transverse C-section [Z98.891] Intrauterine pregnancy: [redacted]w[redacted]d     Secondary diagnosis:  Active Problems:   S/P repeat low transverse C-section  Additional problems: anemia from acute blood loss   Discharge diagnosis: Term Pregnancy Delivered and Anemia                                              Post partum procedures:n/a Augmentation: N/A Complications: None  Hospital course: Sceduled C/S   30 y.o. yo G2P1002 at [redacted]w[redacted]d was admitted to the hospital 06/09/2020 for scheduled cesarean section with the following indication:Elective Repeat.Delivery details are as follows:  Membrane Rupture Time/Date: 7:44 AM ,06/09/2020   Delivery Method:C-Section, Low Transverse  Details of operation can be found in separate operative note.  Patient had an uncomplicated postpartum course.  She is ambulating, tolerating a regular diet, passing flatus, and urinating well. Patient is discharged home in stable condition on  06/11/20        Newborn Data: Birth date:06/09/2020  Birth time:7:45 AM  Gender:Female  Living status:Living  Apgars:10 ,10  Weight:4030 g     Magnesium Sulfate received: No BMZ received: No Rhophylac:N/A  Physical exam  Vitals:   06/10/20 0547 06/10/20 1349 06/10/20 1954 06/11/20 0630  BP: 94/62 (!) 107/53 (!) 90/55 (!) 95/57  Pulse: (!) 58 61 79 75  Resp: 16 17 15 17   Temp: 97.7 F (36.5 C) 97.8 F (36.6 C) 98.4 F (36.9 C) 98.1 F (36.7 C)  TempSrc: Oral Oral Oral Oral  SpO2:  100% 97% 96%  Weight:      Height:       General: alert, cooperative and no distress Lochia: appropriate Uterine Fundus: firm Incision: Dressing is clean, dry, and intact DVT Evaluation: No evidence of  DVT seen on physical exam. Labs: Lab Results  Component Value Date   WBC 8.8 06/11/2020   HGB 8.6 (L) 06/11/2020   HCT 24.9 (L) 06/11/2020   MCV 95.8 06/11/2020   PLT 103 (L) 06/11/2020   No flowsheet data found. Edinburgh Score: Edinburgh Postnatal Depression Scale Screening Tool 06/10/2020  I have been able to laugh and see the funny side of things. 0  I have looked forward with enjoyment to things. 0  I have blamed myself unnecessarily when things went wrong. 0  I have been anxious or worried for no good reason. 0  I have felt scared or panicky for no good reason. 0  Things have been getting on top of me. 0  I have been so unhappy that I have had difficulty sleeping. 0  I have felt sad or miserable. 0  I have been so unhappy that I have been crying. 0  The thought of harming myself has occurred to me. 0  Edinburgh Postnatal Depression Scale Total 0      After visit meds:  Allergies as of 06/11/2020      Reactions   Other    Either betadine or surgical adhesive may have caused a rash       Medication List    TAKE these medications   hypromellose 0.3 % Gel ophthalmic  ointment Commonly known as: GENTEAL Place 1 application into both eyes at bedtime.   ibuprofen 800 MG tablet Commonly known as: ADVIL Take 1 tablet (800 mg total) by mouth every 8 (eight) hours as needed for cramping.   PRENATAL VITAMIN PO Take 1 tablet by mouth daily.        Discharge home in stable condition Infant Feeding: Breast Infant Disposition:home with mother Discharge instruction: per After Visit Summary and Postpartum booklet. Activity: Advance as tolerated. Pelvic rest for 6 weeks.  Diet: routine diet Anticipated Birth Control: Unsure Postpartum Appointment:6 weeks Additional Postpartum F/U: Incision check 2 weeks Future Appointments:No future appointments. Follow up Visit:  Follow-up Information    Paula Compton, MD. Schedule an appointment as soon as possible for a visit in  2 week(s).   Specialty: Obstetrics and Gynecology Why: For incision check and 6 weeks for postpartum visit Contact information: [address] [place] [address] [phone]                   06/11/2020 Isaiah Serge, DO

## 2020-06-11 NOTE — Lactation Note (Signed)
This note was copied from a baby's chart. Lactation Consultation Note  Patient Name: Grace Zhang [id number] Date: 06/11/2020 Reason for consult: Follow-up assessment;Mother's request;Term;Infant weight loss Age:32 hours  Maternal Data  Mom experienced with breastfeeding. LC noted urine and fecal output reduces over the last few hours. Mom stated infant 3 urine and 1 stool today. While in the room infant had large stool and urine. Feedings consistently every hour asked Mom how she felt about the latch. She denied any pain. No signs of abrasions or nipple trauma.   LC assisted Mom with latching switching from cradle to cross cradle. While latched, LC demonstrated how to get a deep latch, ensure lips are flanged, to look for signs of milk transfer and compress the breast to increase flow. Mom noted change in the depth of the swallows. LC also reviewed with Mom offering EBM from pumping or hand expression after a latch to offer more and extend feedings.   Plan 1. To feed based on cues 8-12x in 24 hour period no more than 4 hours without an attempt. Place infant STS and look for signs of milk transfer.        2. Reviewed breastfeeding supplementation guide based on hours after birth. Mom offer EBM to extend feedings via spoon or bottle. Mom has Medela pump at home.       3. I and O sheet reviewed       4. [facility] brochure of inpatient and outpatient services reviewed.   Feeding Mother's Current Feeding Choice: Breast Milk  LATCH Score Latch: Repeated attempts needed to sustain latch, nipple held in mouth throughout feeding, stimulation needed to elicit sucking reflex.  Audible Swallowing: Spontaneous and intermittent  Type of Nipple: Everted at rest and after stimulation  Comfort (Breast/Nipple): Soft / non-tender  Hold (Positioning): Assistance needed to correctly position infant at breast and maintain latch.  LATCH Score: 8   Lactation Tools Discussed/Used     Interventions Interventions: Breast feeding basics reviewed;Support pillows;Education;Assisted with latch;Position options;Skin to skin;Hand express;Breast compression;Adjust position  Discharge Discharge Education: Engorgement and breast care;Warning signs for feeding baby;Outpatient recommendation  Consult Status Consult Status: Complete    Grace Zhang  [name] 06/11/2020, 1:34 PM

## 2020-06-11 NOTE — Lactation Note (Signed)
This note was copied from a baby's chart. Lactation Consultation Note  Patient Name: Grace Zhang Today's Date: 06/11/2020   Age:32 hours P2, term female infant, -4% weight loss. Per mom, she feels infant is breastfeeding well, most feedings are 15 to 30 minutes in length,  and infant is currently cluster feeding. Mom will continue to breastfeed infant according to cues, latching  infant on both breast during a feeding. Infant was asleep on mom's chest and had recently finished breastfeeding, LC did not observe latch. Mom doesn't have any questions or concerns for LC at this time.  Maternal Data    Feeding    LATCH Score                    Lactation Tools Discussed/Used    Interventions    Discharge    Consult Status      Vicente Serene 06/11/2020, 12:48 AM

## 2020-10-17 ENCOUNTER — Ambulatory Visit: Payer: BC Managed Care – PPO | Admitting: Family Medicine

## 2020-11-18 ENCOUNTER — Ambulatory Visit: Payer: BC Managed Care – PPO | Admitting: Internal Medicine

## 2020-11-21 ENCOUNTER — Ambulatory Visit (INDEPENDENT_AMBULATORY_CARE_PROVIDER_SITE_OTHER): Payer: BC Managed Care – PPO | Admitting: Family Medicine

## 2020-11-21 ENCOUNTER — Encounter: Payer: Self-pay | Admitting: Family Medicine

## 2020-11-21 ENCOUNTER — Other Ambulatory Visit: Payer: Self-pay

## 2020-11-21 VITALS — BP 84/64 | HR 82 | Temp 98.0°F | Ht 65.5 in | Wt 109.4 lb

## 2020-11-21 DIAGNOSIS — L989 Disorder of the skin and subcutaneous tissue, unspecified: Secondary | ICD-10-CM | POA: Diagnosis not present

## 2020-11-21 DIAGNOSIS — R5383 Other fatigue: Secondary | ICD-10-CM | POA: Diagnosis not present

## 2020-11-21 DIAGNOSIS — Z13220 Encounter for screening for lipoid disorders: Secondary | ICD-10-CM

## 2020-11-21 DIAGNOSIS — Z862 Personal history of diseases of the blood and blood-forming organs and certain disorders involving the immune mechanism: Secondary | ICD-10-CM | POA: Diagnosis not present

## 2020-11-21 MED ORDER — VITAMIN D 50 MCG (2000 UT) PO TABS: 1000.0000 [IU] | ORAL_TABLET | Freq: Every day | ORAL | Status: DC

## 2020-11-21 MED ORDER — BETAMETHASONE DIPROPIONATE 0.05 % EX CREA: TOPICAL_CREAM | Freq: Two times a day (BID) | CUTANEOUS | 0 refills | Status: DC

## 2020-11-21 NOTE — Progress Notes (Signed)
Grace Zhang DOB: 08-Sep-1990 Encounter date: 11/21/2020  This isa 30 y.o. female who presents to establish care. Chief Complaint  Patient presents with   Establish Care    History of present illness: Was seeing Dr. [name] in the past, but stopped seeing her regularly because she had other specialists that were caring for her.   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 Kids stay up a little later and very hard to get rested and get to sleep on time. Mother in law watches kids in the morning; she is home by 1 and takes care of them for rest of the day.   Eats pretty healthy. Does run/walk usually pushes kids with her.   Headaches: she had oligohydramnios when pregnant and started increasing water intake. This seemed to help headaches overall. Usually she will take ES tylenol if she gets one or motrin and this helps. Maybe once every couple of weeks.   Has chronic dry eye - feels like this contributes to headaches.   Using condoms, breast feeding for ocp now.   She has lower bp; this was monitored in her pregnancies. At one point they considered transfusion before she left hospital but sent her on iron. She did have bloodwork at 6 week post partum and this was normal. She is not taking iron now.   Taking B12, magnesium, vitamin D, prenatal   Lesion right lower leg that was treated with cryotherapy by dermatologist about 1 month ago.  Now feels a bump under lesion.  No significant tenderness.  Lesion is red and it bothers her cosmetically.  No drainage from lesion.   Past Medical History:  Diagnosis Date   Acne - sees dermatologist 11/08/2013   Breech presentation 04/24/2018   Frequent headaches    S/P repeat low transverse C-section 06/09/2020   Past Surgical History:  Procedure Laterality Date   BUNIONECTOMY Right 10/14   CESAREAN SECTION N/A 05/03/2018   Procedure: CESAREAN SECTION;  Surgeon: Paula Compton, MD;  Location:  [facility];  Service: Obstetrics;  Laterality: N/A;  MD Request RNFA   CESAREAN SECTION N/A 06/09/2020   Procedure: Repeat CESAREAN SECTION;  Surgeon: Paula Compton, MD;  Location: [facility];  Service: Obstetrics;  Laterality: N/A;  request RNFA if one by then   EYE SURGERY     REFRACTIVE SURGERY     Allergies  Allergen Reactions   Other     Either betadine or surgical adhesive may have caused a rash    Current Meds  Medication Sig   Ascorbic Acid (VITAMIN C PO) Take by mouth daily.   betamethasone dipropionate 0.05 % cream Apply topically 2 (two) times daily.   Cholecalciferol (VITAMIN D) 50 MCG (2000 UT) tablet Take 0.5 tablets (1,000 Units total) by mouth daily.   Cyanocobalamin (B-12 PO) Take by mouth daily.   hypromellose (GENTEAL) 0.3 % GEL ophthalmic ointment Place 1 application into both eyes at bedtime.   MAGNESIUM PO Take by mouth daily.   Prenatal Vit-Fe Fumarate-FA (PRENATAL VITAMIN PO) Take 1 tablet by mouth daily.    [DISCONTINUED] VITAMIN D PO Take by mouth daily.   Social History   Tobacco Use   Smoking status: Never   Smokeless tobacco: Never  Substance Use Topics   Alcohol use: Not Currently   Family History  Problem Relation Age of Onset   Thyroid disease Mother    Osteoporosis Mother  Basal cell carcinoma Mother    Interstitial cystitis Mother    Osteoporosis Maternal Grandmother    Arthritis Maternal Grandmother    Hypertension Maternal Grandmother    Cancer Maternal Grandfather        prostate   Stroke Maternal Grandfather    Hypertension Maternal Grandfather    Basal cell carcinoma Maternal Grandfather      Review of Systems  Constitutional:  Positive for fatigue. Negative for chills and fever.  Respiratory:  Negative for cough, chest tightness, shortness of breath and wheezing.   Cardiovascular:  Negative for chest pain, palpitations and leg swelling.   Objective:  BP (!) 84/64 (BP Location: Left Arm, Patient Position: Sitting,  Cuff Size: Normal)   Pulse 82   Temp 98 F (36.7 C) (Rectal)   Ht 5' 5.5" (1.664 m)   Wt 109 lb 6.4 oz (49.6 kg)   LMP 09/01/2019   Breastfeeding Yes   BMI 17.93 kg/m   Weight: 109 lb 6.4 oz (49.6 kg)   BP Readings from Last 3 Encounters:  11/21/20 (!) 84/64  06/11/20 (!) 95/57  05/06/18 98/61   Wt Readings from Last 3 Encounters:  11/21/20 109 lb 6.4 oz (49.6 kg)  06/09/20 145 lb (65.8 kg)  06/03/20 147 lb (66.7 kg)    Physical Exam Constitutional:      General: She is not in acute distress.    Appearance: She is well-developed.  Cardiovascular:     Rate and Rhythm: Normal rate and regular rhythm.     Heart sounds: Normal heart sounds. No murmur heard.   No friction rub.  Pulmonary:     Effort: Pulmonary effort is normal. No respiratory distress.     Breath sounds: Normal breath sounds. No wheezing or rales.  Musculoskeletal:     Right lower leg: No edema.     Left lower leg: No edema.  Skin:    Comments: Anterior lower leg erythematous, blanching dermal papule. With direct pressure, mild tenderness. This feels more like a scar reaction or fibroma.   Neurological:     Mental Status: She is alert and oriented to person, place, and time.  Psychiatric:        Behavior: Behavior normal.    Assessment/Plan:  1. Fatigue, unspecified type We discussed working on getting more sleep.  Lack of sleep seems to be creating more sensation of stress and some irritability.  She would prefer to avoid medication treatment for this if possible.  We discussed maximizing amount of sleep by scheduling bedtime routine at nap with the children so she does not feel like she is missing out, and perhaps doing bedtime routine on Thursday through Saturday but making sure to get at least 6 hours of sleep on other nights (also goal of 7 hours).  We discussed working on this slowly by increasing sleep about 30 minutes each night, which will help children transition with bedtime routine. -  Comprehensive metabolic panel; Future - TSH; Future - VITAMIN D 25 Hydroxy (Vit-D Deficiency, Fractures); Future - VITAMIN D 25 Hydroxy (Vit-D Deficiency, Fractures) - TSH - Comprehensive metabolic panel  2. History of anemia Recheck blood work to make sure anemia has resolved.  She is currently not menstruating. - CBC with Differential/Platelet; Future - Vitamin B12; Future - Iron, TIBC and Ferritin Panel; Future - Iron, TIBC and Ferritin Panel - Vitamin B12 - CBC with Differential/Platelet  3. Lipid screening - Lipid panel; Future - Lipid panel  4. Skin lesion Short 2 week  trial steroid to see if we can improve erythema (which bothers her) and to see if any improvement in density of lesion. If no improvement she would be interested in surgical removal. Since she is concerned with appearance (this area is visible on camera) suggested derm follow up for this; but if delay with getting in; we can remove in office.  - Ambulatory referral to Dermatology - betamethasone dipropionate 0.05 % cream; Apply topically 2 (two) times daily.  Dispense: 30 g; Refill: 0   Return in about 6 months (around 05/24/2021) for physical exam.  Micheline Rough, MD 40 minutes spent with patient, discussion of sleep/routine with kids and family, evaluation today and follow up plan, charting, chart review.

## 2020-11-21 NOTE — Patient Instructions (Addendum)
Work on scheduling an earlier bedtime.   Betamethasone to spot on leg twice daily x 2 weeks. If no improvement in redness, then stop.

## 2020-11-22 LAB — COMPREHENSIVE METABOLIC PANEL
AG Ratio: 2.4 (calc) (ref 1.0–2.5)
ALT: 15 U/L (ref 6–29)
AST: 17 U/L (ref 10–30)
Albumin: 5.1 g/dL (ref 3.6–5.1)
Alkaline phosphatase (APISO): 85 U/L (ref 31–125)
BUN: 17 mg/dL (ref 7–25)
CO2: 29 mmol/L (ref 20–32)
Calcium: 9.8 mg/dL (ref 8.6–10.2)
Chloride: 102 mmol/L (ref 98–110)
Creat: 0.83 mg/dL (ref 0.50–0.97)
Globulin: 2.1 g/dL (calc) (ref 1.9–3.7)
Glucose, Bld: 83 mg/dL (ref 65–99)
Potassium: 4.5 mmol/L (ref 3.5–5.3)
Sodium: 139 mmol/L (ref 135–146)
Total Bilirubin: 0.6 mg/dL (ref 0.2–1.2)
Total Protein: 7.2 g/dL (ref 6.1–8.1)

## 2020-11-22 LAB — CBC WITH DIFFERENTIAL/PLATELET
Absolute Monocytes: 392 cells/uL (ref 200–950)
Basophils Absolute: 98 cells/uL (ref 0–200)
Basophils Relative: 1.4 %
Eosinophils Absolute: 182 cells/uL (ref 15–500)
Eosinophils Relative: 2.6 %
HCT: 39 % (ref 35.0–45.0)
Hemoglobin: 13.2 g/dL (ref 11.7–15.5)
Lymphs Abs: 2667 cells/uL (ref 850–3900)
MCH: 30.4 pg (ref 27.0–33.0)
MCHC: 33.8 g/dL (ref 32.0–36.0)
MCV: 89.9 fL (ref 80.0–100.0)
MPV: 12.2 fL (ref 7.5–12.5)
Monocytes Relative: 5.6 %
Neutro Abs: 3661 cells/uL (ref 1500–7800)
Neutrophils Relative %: 52.3 %
Platelets: 244 10*3/uL (ref 140–400)
RBC: 4.34 10*6/uL (ref 3.80–5.10)
RDW: 14.1 % (ref 11.0–15.0)
Total Lymphocyte: 38.1 %
WBC: 7 10*3/uL (ref 3.8–10.8)

## 2020-11-22 LAB — VITAMIN D 25 HYDROXY (VIT D DEFICIENCY, FRACTURES): Vit D, 25-Hydroxy: 69 ng/mL (ref 30–100)

## 2020-11-22 LAB — LIPID PANEL
Cholesterol: 162 mg/dL (ref <200)
HDL: 93 mg/dL (ref >=50)
LDL Cholesterol (Calc): 57 mg/dL (calc)
Non-HDL Cholesterol (Calc): 69 mg/dL (calc) (ref <130)
Total CHOL/HDL Ratio: 1.7 (calc) (ref <5.0)
Triglycerides: 43 mg/dL (ref <150)

## 2020-11-22 LAB — IRON,TIBC AND FERRITIN PANEL
%SAT: 21 % (calc) (ref 16–45)
Ferritin: 21 ng/mL (ref 16–154)
Iron: 69 ug/dL (ref 40–190)
TIBC: 327 mcg/dL (calc) (ref 250–450)

## 2020-11-22 LAB — VITAMIN B12: Vitamin B-12: 779 pg/mL (ref 200–1100)

## 2020-11-22 LAB — TSH: TSH: 1.5 mIU/L

## 2020-12-12 ENCOUNTER — Encounter: Payer: Self-pay | Admitting: Family Medicine

## 2020-12-12 ENCOUNTER — Other Ambulatory Visit: Payer: Self-pay

## 2020-12-12 ENCOUNTER — Ambulatory Visit (INDEPENDENT_AMBULATORY_CARE_PROVIDER_SITE_OTHER): Payer: BC Managed Care – PPO | Admitting: Family Medicine

## 2020-12-12 VITALS — BP 90/58 | HR 80 | Temp 97.5°F | Ht 65.5 in | Wt 106.3 lb

## 2020-12-12 DIAGNOSIS — D2371 Other benign neoplasm of skin of right lower limb, including hip: Secondary | ICD-10-CM

## 2020-12-12 DIAGNOSIS — L989 Disorder of the skin and subcutaneous tissue, unspecified: Secondary | ICD-10-CM

## 2020-12-12 MED ORDER — MUPIROCIN 2 % EX OINT: 1.0000 "application " | TOPICAL_OINTMENT | Freq: Two times a day (BID) | CUTANEOUS | 0 refills | Status: DC

## 2020-12-12 NOTE — Patient Instructions (Addendum)
Keep wound moist with antibiotic ointment. Let me know if any concerns with healing.
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
## 2020-12-22 ENCOUNTER — Ambulatory Visit: Payer: BC Managed Care – PPO | Admitting: Family Medicine

## 2020-12-26 ENCOUNTER — Ambulatory Visit (INDEPENDENT_AMBULATORY_CARE_PROVIDER_SITE_OTHER): Payer: BC Managed Care – PPO | Admitting: Family Medicine

## 2020-12-26 ENCOUNTER — Other Ambulatory Visit: Payer: Self-pay

## 2020-12-26 ENCOUNTER — Encounter: Payer: Self-pay | Admitting: Family Medicine

## 2020-12-26 VITALS — BP 84/48 | HR 70 | Temp 98.0°F | Ht 65.5 in | Wt 106.0 lb

## 2020-12-26 DIAGNOSIS — Z4802 Encounter for removal of sutures: Secondary | ICD-10-CM

## 2020-12-26 NOTE — Progress Notes (Signed)
  Grace Zhang DOB: [birth year] Encounter date: 12/26/2020  This is a 30 y.o. female who presents for suture removal   Sutures placed by me on 12/12/20. She did well with healing. No redness, drainage, concerns.     Allergies  Allergen Reactions   Other     Either betadine or surgical adhesive may have caused a rash    Current Meds  Medication Sig   Ascorbic Acid (VITAMIN C PO) Take by mouth daily.   Cholecalciferol (VITAMIN D) 50 MCG (2000 UT) tablet Take 0.5 tablets (1,000 Units total) by mouth daily.   Cyanocobalamin (B-12 PO) Take by mouth daily.   hypromellose (GENTEAL) 0.3 % GEL ophthalmic ointment Place 1 application into both eyes at bedtime.   MAGNESIUM PO Take by mouth daily.   Prenatal Vit-Fe Fumarate-FA (PRENATAL VITAMIN PO) Take 1 tablet by mouth daily.    [DISCONTINUED] mupirocin ointment (BACTROBAN) 2 % Apply 1 application topically 2 (two) times daily.    Review of Systems  Constitutional:  Negative for chills, fatigue and fever.  Respiratory:  Negative for cough, chest tightness, shortness of breath and wheezing.   Cardiovascular:  Negative for chest pain, palpitations and leg swelling.   Objective:  BP (!) 84/48 (BP Location: Left Arm, Patient Position: Sitting, Cuff Size: Normal)   Pulse 70   Temp 98 F (36.7 C) (Oral)   Ht 5' 5.5" (1.664 m)   Wt 106 lb (48.1 kg)   BMI 17.37 kg/m   Weight: 106 lb (48.1 kg)   BP Readings from Last 3 Encounters:  12/26/20 (!) 84/48  12/12/20 (!) 90/58  11/21/20 (!) 84/64   Wt Readings from Last 3 Encounters:  12/26/20 106 lb (48.1 kg)  12/12/20 106 lb 4.8 oz (48.2 kg)  11/21/20 109 lb 6.4 oz (49.6 kg)    Physical Exam Constitutional:      Appearance: Normal appearance.  Neurological:     Mental Status: She is alert.   Suture removal Biopsy results discussed with pt yes: dermatofibroma Anterior right leg -  3sutures removed with scissors; clean wound, no crusting or drainage; good skin  approximation. Sutures removed in entirety; patient tolerated well.   Assessment: suture removal  Plan Ok to apply vitamin E to help with scar healing and moisturizing.  Follow up prn    Micheline Rough, MD

## 2021-01-16 ENCOUNTER — Encounter: Payer: Self-pay | Admitting: Family Medicine

## 2021-02-02 ENCOUNTER — Encounter: Payer: Self-pay | Admitting: Family Medicine

## 2021-02-02 NOTE — Telephone Encounter (Signed)
Patient called to follow up on question about MRI and to also ask if Dr.Koberlein will take her husband on as a new patient. They are under same health insurance and his name is Bathsheba Durrett DOB:12/25/1984      Good callback number is [phone]      Please Advise

## 2021-03-31 ENCOUNTER — Encounter: Payer: Self-pay | Admitting: Family Medicine

## 2021-04-03 NOTE — Telephone Encounter (Signed)
Spoke with the patient and scheduled an appt on 12/14 for a virtual visit to discuss migraines.  Also advised her an appt will be scheduled for her husband on 12/23 to arrive at 10:15am.  Message sent to front office supervisor to add the time slot.

## 2021-04-15 ENCOUNTER — Telehealth: Payer: BC Managed Care – PPO | Admitting: Family Medicine

## 2021-04-24 ENCOUNTER — Ambulatory Visit (INDEPENDENT_AMBULATORY_CARE_PROVIDER_SITE_OTHER): Payer: BC Managed Care – PPO | Admitting: Family Medicine

## 2021-04-24 ENCOUNTER — Other Ambulatory Visit: Payer: Self-pay

## 2021-04-24 ENCOUNTER — Encounter: Payer: Self-pay | Admitting: Family Medicine

## 2021-04-24 ENCOUNTER — Ambulatory Visit (INDEPENDENT_AMBULATORY_CARE_PROVIDER_SITE_OTHER): Payer: BC Managed Care – PPO

## 2021-04-24 VITALS — BP 80/48 | HR 63 | Temp 97.5°F | Ht 65.5 in | Wt 103.8 lb

## 2021-04-24 DIAGNOSIS — G43809 Other migraine, not intractable, without status migrainosus: Secondary | ICD-10-CM

## 2021-04-24 DIAGNOSIS — M79676 Pain in unspecified toe(s): Secondary | ICD-10-CM

## 2021-04-24 NOTE — Progress Notes (Signed)
Grace Zhang DOB: 1990/06/09 Encounter date: 04/24/2021  This is a 30 y.o. female who presents with Chief Complaint  Patient presents with   Migraine   Toe Injury    Patient complains of left 5th toe pain since she tripped over her son's stool 4 days ago    History of present illness:  Bruising has improved on toe, but still throbbing today. Pain is better, but still really store. Bruised top/inside.   Had reached out due to migraines being worse, but has really been working hard on sleep hygiene, hydration, caffeine intake and has had some improvement. But just worries that in past it was suggested she have migraine, was getting more optical migraines, associated limb numbness. 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 Typically gets bad migraine once a month. Headaches once a week that typically will go away with otc meds. With worse migraine, doesn't have nausea, vomiting. Does have sound sensitivity. Feels like whole head when it hurts. Throbbing more than stabbing.   No issues with dizziness or light headedness. In past when eye was involved she would see spots in visual field. This hasn't happened in probably a few years.    Allergies  Allergen Reactions   Other     Either betadine or surgical adhesive may have caused a rash    Current Meds  Medication Sig   Ascorbic Acid (VITAMIN C PO) Take by mouth daily.   Cholecalciferol (VITAMIN D) 50 MCG (2000 UT) tablet Take 0.5 tablets (1,000 Units total) by mouth daily.   Cyanocobalamin (B-12 PO) Take by mouth daily.   hypromellose (GENTEAL) 0.3 % GEL ophthalmic ointment Place 1 application into both eyes at bedtime.   MAGNESIUM PO Take by mouth daily.   Prenatal Vit-Fe Fumarate-FA (PRENATAL VITAMIN PO) Take 1 tablet by mouth daily.     Review of Systems  Constitutional:  Positive for fatigue (been rough month -kids with RSV, illness going around). Negative for chills and fever.  Respiratory:   Negative for cough, chest tightness, shortness of breath and wheezing.   Cardiovascular:  Negative for chest pain, palpitations and leg swelling.  Musculoskeletal:  Positive for arthralgias.   Objective:  BP (!) 80/48 (BP Location: Left Arm, Patient Position: Sitting, Cuff Size: Normal)    Pulse 63    Temp (!) 97.5 F (36.4 C) (Oral)    Ht 5' 5.5" (1.664 m)    Wt 103 lb 12.8 oz (47.1 kg)    SpO2 99%    Breastfeeding Yes    BMI 17.01 kg/m   Weight: 103 lb 12.8 oz (47.1 kg)   BP Readings from Last 3 Encounters:  04/24/21 (!) 80/48  12/26/20 (!) 84/48  12/12/20 (!) 90/58   Wt Readings from Last 3 Encounters:  04/24/21 103 lb 12.8 oz (47.1 kg)  12/26/20 106 lb (48.1 kg)  12/12/20 106 lb 4.8 oz (48.2 kg)    Physical Exam Constitutional:      General: She is not in acute distress.    Appearance: She is well-developed. She is not diaphoretic.  HENT:     Head: Normocephalic and atraumatic.     Right Ear: External ear normal.     Left Ear: External ear normal.  Eyes:     Conjunctiva/sclera: Conjunctivae normal.     Pupils: Pupils are equal, round, and reactive to light.  Neck:     Thyroid: No thyromegaly.  Cardiovascular:     Rate and Rhythm: Normal rate and  regular rhythm.     Heart sounds: Normal heart sounds. No murmur heard.   No friction rub. No gallop.  Pulmonary:     Effort: Pulmonary effort is normal. No respiratory distress.     Breath sounds: Normal breath sounds. No wheezing or rales.  Musculoskeletal:     Cervical back: Neck supple.     Comments: There is bruising dorsal aspect of fifth digit left foot.  There is some edema of joints and fifth digit.  Tenderness with mobility of the digit, although she is able to flex and extend.  Lymphadenopathy:     Cervical: No cervical adenopathy.  Skin:    General: Skin is warm and dry.  Neurological:     Mental Status: She is alert and oriented to person, place, and time.     Cranial Nerves: No cranial nerve deficit.      Motor: No abnormal muscle tone.     Deep Tendon Reflexes: Reflexes normal.     Reflex Scores:      Tricep reflexes are 2+ on the right side and 2+ on the left side.      Bicep reflexes are 2+ on the right side and 2+ on the left side.      Brachioradialis reflexes are 2+ on the right side and 2+ on the left side.      Patellar reflexes are 2+ on the right side and 2+ on the left side. Psychiatric:        Behavior: Behavior normal.    Assessment/Plan 1. Other migraine without status migrainosus, not intractable Normal neurologic exam today.  Headaches are better overall in terms of intensity and frequency.  Migraines are less in terms of intensity and frequency.  She has not had any additional neurologic symptoms (like she has in the past).  I do not feel that she needs further evaluation with imaging at this time.  Certainly if headaches worsened or not as receptive to treatment I would consider this.  2. Pain of toe, unspecified laterality Start with x-ray today.  Further advice pending results. - DG Toe 5th Left; Future  Return if symptoms worsen or fail to improve.    Micheline Rough, MD

## 2021-04-24 NOTE — Patient Instructions (Signed)
Consider riboflavin 400mg  daily for headache prophylaxis.

## 2021-04-25 ENCOUNTER — Encounter: Payer: Self-pay | Admitting: Family Medicine

## 2021-04-28 NOTE — Telephone Encounter (Signed)
Pt called into the office to get clarification I discussed the below response.

## 2021-05-01 ENCOUNTER — Encounter: Payer: Self-pay | Admitting: Family Medicine

## 2021-05-13 ENCOUNTER — Encounter: Payer: Self-pay | Admitting: Family Medicine

## 2021-05-29 ENCOUNTER — Ambulatory Visit (INDEPENDENT_AMBULATORY_CARE_PROVIDER_SITE_OTHER): Payer: BC Managed Care – PPO | Admitting: Family Medicine

## 2021-05-29 ENCOUNTER — Encounter: Payer: Self-pay | Admitting: Family Medicine

## 2021-05-29 VITALS — BP 94/60 | HR 66 | Temp 97.9°F | Ht 65.0 in | Wt 104.9 lb

## 2021-05-29 DIAGNOSIS — Z0000 Encounter for general adult medical examination without abnormal findings: Secondary | ICD-10-CM | POA: Diagnosis not present

## 2021-05-29 DIAGNOSIS — M79675 Pain in left toe(s): Secondary | ICD-10-CM

## 2021-05-29 DIAGNOSIS — K429 Umbilical hernia without obstruction or gangrene: Secondary | ICD-10-CM

## 2021-05-29 NOTE — Patient Instructions (Signed)
Let me know if toe is not feeling better in 4 weeks time with hard shoe as much as possible

## 2021-05-29 NOTE — Progress Notes (Signed)
Grace Zhang DOB: 16-Feb-1991 Encounter date: 05/29/2021  This is a 31 y.o. female who presents for complete physical   History of present illness/Additional concerns:  Regular dental and eye exams.   Follows with Dr. Marvel Plan for gynecology care. Has appointment in May for regular visit.   Thinks toe is still broken; still swollen.   Past Medical History:  Diagnosis Date   Acne - sees dermatologist 11/08/2013   Breech presentation 04/24/2018   Frequent headaches    S/P repeat low transverse C-section 06/09/2020   Past Surgical History:  Procedure Laterality Date   BUNIONECTOMY Right 01/2013   CESAREAN SECTION N/A 05/03/2018   Procedure: CESAREAN SECTION;  Surgeon: Paula Compton, MD;  Location: [facility];  Service: Obstetrics;  Laterality: N/A;  MD Request RNFA   CESAREAN SECTION N/A 06/09/2020   Procedure: Repeat CESAREAN SECTION;  Surgeon: Paula Compton, MD;  Location: [place] [facility];  Service: Obstetrics;  Laterality: N/A;  request RNFA if one by then   [place] EXTRACTION  2008   Allergies  Allergen Reactions   Other     Either betadine or surgical adhesive may have caused a rash    Current Meds  Medication Sig   Ascorbic Acid (VITAMIN C PO) Take by mouth daily.   Cholecalciferol (VITAMIN D) 50 MCG (2000 UT) tablet Take 0.5 tablets (1,000 Units total) by mouth daily.   Cyanocobalamin (B-12 PO) Take by mouth daily.   hypromellose (GENTEAL) 0.3 % GEL ophthalmic ointment Place 1 application into both eyes at bedtime.   MAGNESIUM PO Take by mouth daily.   Prenatal Vit-Fe Fumarate-FA (PRENATAL VITAMIN PO) Take 1 tablet by mouth daily.    Social History   Tobacco Use   Smoking status: Never   Smokeless tobacco: Never  Substance Use Topics   Alcohol use: Not Currently   Family History  Problem Relation Age of Onset   Thyroid disease Mother    Osteoporosis Mother    Basal cell carcinoma Mother     Interstitial cystitis Mother    Osteoporosis Maternal Grandmother    Arthritis Maternal Grandmother    Hypertension Maternal Grandmother    Cancer Maternal Grandfather        prostate   Stroke Maternal Grandfather    Hypertension Maternal Grandfather    Basal cell carcinoma Maternal Grandfather      Review of Systems  Constitutional:  Negative for activity change, appetite change, chills, fatigue, fever and unexpected weight change.  HENT:  Negative for congestion, ear pain, hearing loss, sinus pressure, sinus pain, sore throat and trouble swallowing.   Eyes:  Negative for pain and visual disturbance.  Respiratory:  Negative for cough, chest tightness, shortness of breath and wheezing.   Cardiovascular:  Negative for chest pain, palpitations and leg swelling.  Gastrointestinal:  Negative for abdominal pain, blood in stool, constipation, diarrhea, nausea and vomiting.  Genitourinary:  Negative for difficulty urinating and menstrual problem.  Musculoskeletal:  Negative for arthralgias and back pain.       Pain pinky toe left; previously dx with fracture  Skin:  Negative for rash.  Neurological:  Negative for dizziness, weakness, numbness and headaches.  Hematological:  Negative for adenopathy. Does not bruise/bleed easily.  Psychiatric/Behavioral:  Negative for sleep disturbance and suicidal ideas. The patient is not nervous/anxious.    CBC:  Lab Results  Component Value Date   WBC 7.0 11/21/2020   HGB 13.2  11/21/2020   HGB 13.2 04/20/2016   HCT 39.0 11/21/2020   MCH 30.4 11/21/2020   MCHC 33.8 11/21/2020   RDW 14.1 11/21/2020   PLT 244 11/21/2020   MPV 12.2 11/21/2020   CMP: Lab Results  Component Value Date   NA 139 11/21/2020   K 4.5 11/21/2020   CL 102 11/21/2020   CO2 29 11/21/2020   GLUCOSE 83 11/21/2020   BUN 17 11/21/2020   CREATININE 0.83 11/21/2020   CALCIUM 9.8 11/21/2020   PROT 7.2 11/21/2020   BILITOT 0.6 11/21/2020   ALT 15 11/21/2020   AST 17  11/21/2020   LIPID: Lab Results  Component Value Date   CHOL 162 11/21/2020   TRIG 43 11/21/2020   HDL 93 11/21/2020   LDLCALC 57 11/21/2020    Objective:  BP 94/60    Pulse 66    Temp 97.9 F (36.6 C) (Oral)    Ht 5\' 5"  (1.651 m)    Wt 104 lb 14.4 oz (47.6 kg)    LMP 08/02/2019    SpO2 99%    Breastfeeding Yes    BMI 17.46 kg/m   Weight: 104 lb 14.4 oz (47.6 kg)   BP Readings from Last 3 Encounters:  05/29/21 94/60  04/24/21 (!) 80/48  12/26/20 (!) 84/48   Wt Readings from Last 3 Encounters:  05/29/21 104 lb 14.4 oz (47.6 kg)  04/24/21 103 lb 12.8 oz (47.1 kg)  12/26/20 106 lb (48.1 kg)    Physical Exam Constitutional:      General: She is not in acute distress.    Appearance: She is well-developed.  HENT:     Head: Normocephalic and atraumatic.     Right Ear: External ear normal.     Left Ear: External ear normal.     Mouth/Throat:     Pharynx: No oropharyngeal exudate.  Eyes:     Conjunctiva/sclera: Conjunctivae normal.     Pupils: Pupils are equal, round, and reactive to light.  Neck:     Thyroid: No thyromegaly.  Cardiovascular:     Rate and Rhythm: Normal rate and regular rhythm.     Heart sounds: Normal heart sounds. No murmur heard.   No friction rub. No gallop.  Pulmonary:     Effort: Pulmonary effort is normal.     Breath sounds: Normal breath sounds.  Abdominal:     General: Abdomen is flat. Bowel sounds are normal. There is no distension.     Palpations: Abdomen is soft. There is no mass.     Tenderness: There is no abdominal tenderness. There is no guarding.     Hernia: A hernia is present. Hernia is present in the umbilical area (small, 1cm reducible).  Musculoskeletal:        General: No tenderness or deformity. Normal range of motion.     Cervical back: Normal range of motion and neck supple.  Feet:     Comments: Left 5th toe with edema. She can bend without pain. Lightly erythematous. Lymphadenopathy:     Cervical: No cervical adenopathy.   Skin:    General: Skin is warm and dry.     Findings: No rash.  Neurological:     Mental Status: She is alert and oriented to person, place, and time.     Deep Tendon Reflexes: Reflexes normal.     Reflex Scores:      Tricep reflexes are 2+ on the right side and 2+ on the left side.  Bicep reflexes are 2+ on the right side and 2+ on the left side.      Brachioradialis reflexes are 2+ on the right side and 2+ on the left side.      Patellar reflexes are 2+ on the right side and 2+ on the left side. Psychiatric:        Speech: Speech normal.        Behavior: Behavior normal.        Thought Content: Thought content normal.    Assessment/Plan: There are no preventive care reminders to display for this patient. Health Maintenance reviewed.  1. Preventative health care Encourage regular activity.  She does follow-up with gynecology regularly.  2. Umbilical hernia without obstruction and without gangrene We discussed umbilical hernia and implications of this.  She is on the fence about future pregnancies, so may be good to have evaluation discussion with surgeon prior to this.  In hindsight, she feels that hernia was there during first pregnancy and remained through second pregnancy.  She has no pain or discomfort.  We discussed concerning signs of hernia to look for, although risk is typically low. - Ambulatory referral to General Surgery  3. Toe pain, left Encouraged her to get hard soled shoe to wear all the time.  She got this last week and has been wearing it intermittently for a week.  We discussed not wearing soft based tennis shoes.  She did not want to see podiatry at this time but is willing to try to use the hard soled shoe and see if this helps with discomfort and pain in the next few weeks.  She will let me know if pain is not improving.  Return in about 1 year (around 05/29/2022) for physical exam.  Micheline Rough, MD

## 2021-06-03 ENCOUNTER — Encounter: Payer: Self-pay | Admitting: Family Medicine

## 2021-09-09 LAB — HM PAP SMEAR
HM Pap smear: NEGATIVE
HPV, high-risk: NEGATIVE

## 2021-12-15 ENCOUNTER — Encounter: Payer: Self-pay | Admitting: Family Medicine

## 2021-12-15 ENCOUNTER — Ambulatory Visit (INDEPENDENT_AMBULATORY_CARE_PROVIDER_SITE_OTHER): Payer: BC Managed Care – PPO | Admitting: Family Medicine

## 2021-12-15 DIAGNOSIS — K429 Umbilical hernia without obstruction or gangrene: Secondary | ICD-10-CM

## 2021-12-15 DIAGNOSIS — R519 Headache, unspecified: Secondary | ICD-10-CM | POA: Diagnosis not present

## 2021-12-15 DIAGNOSIS — I9589 Other hypotension: Secondary | ICD-10-CM | POA: Diagnosis not present

## 2021-12-15 DIAGNOSIS — I959 Hypotension, unspecified: Secondary | ICD-10-CM | POA: Insufficient documentation

## 2021-12-15 NOTE — Progress Notes (Signed)
   Established Patient Office Visit  Subjective   Patient ID: Grace Zhang, female    DOB: 06-08-1990  Age: 31 y.o. MRN: [id number]  Chief Complaint  Patient presents with  . Establish Care    Patient is here for transition of care visit. Patient states she usually sees her PCP once a year. States that she has a history of small umbilical hernia and is meeting with the general surgeon at the end of the month to discuss the laparoscopic procedure.  Low BP/ palpitations-- patient is concerned about her low blood pressure. States that she has always had low pressure in the past, has been trying    Patient Active Problem List   Diagnosis Date Noted  . Headache disorder 12/15/2021  . Hypotension 12/15/2021      Review of Systems  All other systems reviewed and are negative.     Objective:     BP 90/60 (BP Location: Left Arm, Patient Position: Sitting, Cuff Size: Normal)   Pulse 82   Temp 98.5 F (36.9 C) (Oral)   Ht '5\' 5"'$  (1.651 m)   Wt 112 lb 4.8 oz (50.9 kg)   LMP 11/25/2021 (Exact Date)   SpO2 100%   BMI 18.69 kg/m  BP Readings from Last 3 Encounters:  12/15/21 90/60  05/29/21 94/60  04/24/21 (!) 80/48      Physical Exam Abdominal:       Comments: Small 1 cm umbilical hernia felt on exam.      No results found for any visits on 12/15/21.  Last metabolic panel Lab Results  Component Value Date   GLUCOSE 83 11/21/2020   NA 139 11/21/2020   K 4.5 11/21/2020   CL 102 11/21/2020   CO2 29 11/21/2020   BUN 17 11/21/2020   CREATININE 0.83 11/21/2020   CALCIUM 9.8 11/21/2020   PROT 7.2 11/21/2020   BILITOT 0.6 11/21/2020   AST 17 11/21/2020   ALT 15 11/21/2020      The ASCVD Risk score (Arnett DK, et al., 2019) failed to calculate for the following reasons:   The 2019 ASCVD risk score is only valid for ages 22 to 73    Assessment & Plan:   Problem List Items Addressed This Visit       Cardiovascular and Mediastinum   Hypotension  (Chronic)     Other   Headache disorder    No follow-ups on file.    Farrel Conners, MD

## 2021-12-16 DIAGNOSIS — K429 Umbilical hernia without obstruction or gangrene: Secondary | ICD-10-CM | POA: Insufficient documentation

## 2021-12-16 NOTE — Assessment & Plan Note (Signed)
1 cm, small on exam, the risk of incarceration of bowel of other tissue is relatively small at this point. Patient is not sure if she wishes for future pregnancy and this might impact wether or not she will have the hernia repaired. I recommended that she gather more information and speak with the surgeon at her appointment to determine if she should undergo the procedure.

## 2021-12-16 NOTE — Assessment & Plan Note (Signed)
We discussed that this could be normal for her, however she does endorse some symptoms of hypotension including dizziness with standing and intermittent palpitations. I will place an order for referral to cardiology for possible need for holter monitor and cardiac tilt table study.

## 2021-12-16 NOTE — Assessment & Plan Note (Signed)
Patient takes occasional OTC medication for this, reports about 7 headache days per month, no aura or prodromal sx noted. Pt states she had Botox done on her face and this actually helped her headaches improved. I recommended that if her headaches worsen we could potentially start a once daily preventative for this in the future.

## 2022-01-14 ENCOUNTER — Encounter (HOSPITAL_BASED_OUTPATIENT_CLINIC_OR_DEPARTMENT_OTHER): Payer: Self-pay | Admitting: Cardiology

## 2022-01-14 ENCOUNTER — Ambulatory Visit (INDEPENDENT_AMBULATORY_CARE_PROVIDER_SITE_OTHER): Payer: BC Managed Care – PPO | Admitting: Cardiology

## 2022-01-14 VITALS — BP 101/66 | Ht 65.5 in | Wt 113.0 lb

## 2022-01-14 DIAGNOSIS — Z7189 Other specified counseling: Secondary | ICD-10-CM | POA: Diagnosis not present

## 2022-01-14 DIAGNOSIS — R002 Palpitations: Secondary | ICD-10-CM | POA: Diagnosis not present

## 2022-01-14 DIAGNOSIS — I9589 Other hypotension: Secondary | ICD-10-CM | POA: Diagnosis not present

## 2022-01-14 NOTE — Progress Notes (Signed)
Cardiology Office Note:    Date:  01/14/2022   ID:  [facility] Grace, Zhang 08-17-1990, MRN [id number]  PCP:  Farrel Conners, MD  Cardiologist:  None  Referring MD: Farrel Conners, MD   No chief complaint on file.   History of Present Illness:    Grace Zhang is a 31 y.o. female with a hx of hypotension, lightheadedness and palpitations who is seen as a new consult at the request of Farrel Conners, MD for the evaluation and management of hypotension.  Today:  Her main concern today is her low blood pressure. She has a history of hypotension on and off throughout her life, but it has never been this low except for when she had a caesarean when she gave birth to her daughter.   Lately she has been having dizzy spells and experiencing heart palpitations. Both are new symptoms.When she has a dizzy spell it is usually when she gets up very fast. It doesn't happen every day, happens 1-2 times per week. It will resolve when she rest for a minute.  She denies any syncope.   She is experiencing a lot of fatigue. Her work schedule varies and that may be throwing off her circadian rhythm and contributing to the problem. She is very active with her kids and work but it starting to have trouble getting through her more hectic days.  She has also been having more episodes where she feels like she is "spacing out" or is "on the outside looking in". It will go away after a minute or so, but the episodes are happening more frequently.  She is not sure if this may be related to her increased fatigue. The sensation she feels during an episode is mostly in her head and her eyes. She may feel like it gets blurry for a second.  Sometimes after these episodes she may have a head ache after the episode passes.   She is not sure how long all of these symptoms described above have been going on, but notes that these symptoms have been exacerbated significantly since she had kids about  3 years ago.   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 She had one episode of syncope after she had her son which caused her to lose her vision for a short period of time. All these symptoms have been very rare and very short in duration.   She denies chest pain, shortness of breath, or peripheral edema. No headaches, syncope, orthopnea, or PND.   (+) lightheadedness (+) palpitations (+) headache  (+) blurry vision   Past Medical History:  Diagnosis Date   Acne - sees dermatologist 11/08/2013   Breech presentation 04/24/2018   Frequent headaches    S/P repeat low transverse C-section 06/09/2020    Past Surgical History:  Procedure Laterality Date   BUNIONECTOMY Right 01/2013   CESAREAN SECTION N/A 05/03/2018   Procedure: CESAREAN SECTION;  Surgeon: Paula Compton, MD;  Location: [facility];  Service: Obstetrics;  Laterality: N/A;  MD Request RNFA   CESAREAN SECTION N/A 06/09/2020   Procedure: Repeat CESAREAN SECTION;  Surgeon: Paula Compton, MD;  Location: [place] [facility];  Service: Obstetrics;  Laterality: N/A;  request RNFA if one by then   [facility] EXTRACTION  2008    Current Medications: Current Outpatient Medications on File Prior to  Visit  Medication Sig   Ascorbic Acid (VITAMIN C PO) Take by mouth daily.   Cholecalciferol (VITAMIN D) 50 MCG (2000 UT) tablet Take 0.5 tablets (1,000 Units total) by mouth daily.   Cyanocobalamin (B-12 PO) Take by mouth daily.   hypromellose (GENTEAL) 0.3 % GEL ophthalmic ointment Place 1 application into both eyes at bedtime.   MAGNESIUM PO Take by mouth daily.   Prenatal Vit-Fe Fumarate-FA (PRENATAL VITAMIN PO) Take 1 tablet by mouth daily.    No current facility-administered medications on file prior to visit.     Allergies:   Other   Social History   Tobacco Use   Smoking status: Never   Smokeless tobacco:  Never  Vaping Use   Vaping Use: Never used  Substance Use Topics   Alcohol use: Not Currently   Drug use: No    Family History: family history includes Arthritis in her maternal grandmother; Basal cell carcinoma in her maternal grandfather and mother; Cancer in her maternal grandfather; Hypertension in her maternal grandfather and maternal grandmother; Interstitial cystitis in her mother; Osteoporosis in her maternal grandmother and mother; Stroke in her maternal grandfather; Thyroid disease in her mother.  ROS:   Please see the history of present illness.  Additional pertinent ROS: Constitutional: Negative for chills, fever, night sweats, unintentional weight loss  HENT: Negative for ear pain and hearing loss.   Eyes: Negative for loss of vision and eye pain.  Respiratory: Negative for cough, sputum, wheezing.   Cardiovascular: See HPI. Gastrointestinal: Negative for abdominal pain, melena, and hematochezia.  Genitourinary: Negative for dysuria and hematuria.  Musculoskeletal: Negative for falls and myalgias.  Skin: Negative for itching and rash.  Neurological: Negative for focal weakness, focal sensory changes and loss of consciousness.  Endo/Heme/Allergies: Does not bruise/bleed easily.     EKGs/Labs/Other Studies Reviewed:    The following studies were reviewed today:  No prior cardiovascular studies available.   EKG:  EKG is personally reviewed.   01/14/2022: ***  Recent Labs: No results found for requested labs within last 365 days.  Recent Lipid Panel    Component Value Date/Time   CHOL 162 11/21/2020 1630   TRIG 43 11/21/2020 1630   HDL 93 11/21/2020 1630   CHOLHDL 1.7 11/21/2020 1630   LDLCALC 57 11/21/2020 1630    Physical Exam:    VS:  LMP 11/25/2021 (Exact Date)     Wt Readings from Last 3 Encounters:  12/15/21 112 lb 4.8 oz (50.9 kg)  05/29/21 104 lb 14.4 oz (47.6 kg)  04/24/21 103 lb 12.8 oz (47.1 kg)    GEN: Well nourished, well developed in no  acute distress HEENT: Normal, moist mucous membranes NECK: No JVD CARDIAC: regular rhythm, normal S1 and S2, no rubs or gallops. No murmur. VASCULAR: Radial and DP pulses 2+ bilaterally. No carotid bruits RESPIRATORY:  Clear to auscultation without rales, wheezing or rhonchi  ABDOMEN: Soft, non-tender, non-distended MUSCULOSKELETAL:  Ambulates independently SKIN: Warm and dry, no edema NEUROLOGIC:  Alert and oriented x 3. No focal neuro deficits noted. PSYCHIATRIC:  Normal affect    ASSESSMENT:    No diagnosis found. PLAN:     Cardiac risk counseling and prevention recommendations: -recommend heart healthy/Mediterranean diet, with whole grains, fruits, vegetable, fish, lean meats, nuts, and olive oil. Limit salt. -recommend moderate walking, 3-5 times/week for 30-50 minutes each session. Aim for at least 150 minutes.week. Goal should be pace of 3 miles/hours, or walking 1.5 miles in 30 minutes -recommend avoidance of tobacco  products. Avoid excess alcohol. -ASCVD risk score: The ASCVD Risk score (Arnett DK, et al., 2019) failed to calculate for the following reasons:   The 2019 ASCVD risk score is only valid for ages 35 to 80    Plan for follow up:PRN  Buford Dresser, MD, PhD, [facility]    Medication Adjustments/Labs and Tests Ordered: Current medicines are reviewed at length with the patient today.  Concerns regarding medicines are outlined above.  No orders of the defined types were placed in this encounter.  No orders of the defined types were placed in this encounter.   I,Jessica Ford,acting as a Education administrator for [employer], MD.,have documented all relevant documentation on the behalf of Buford Dresser, MD,as directed by  Buford Dresser, MD while in the presence of Buford Dresser, MD.   ***   There are no Patient Instructions on file for this visit.  Signed, Buford Dresser, MD PhD 01/14/2022 2:09 PM     [place]

## 2022-01-14 NOTE — Patient Instructions (Signed)
Medication Instructions:  Your Physician recommend you continue on your current medication as directed.    *If you need a refill on your cardiac medications before your next appointment, please call your pharmacy*   Lab Work: None ordered today   Testing/Procedures: None ordered today   Follow-Up: At [facility], you and your health needs are our priority.  As part of our continuing mission to provide you with exceptional heart care, we have created designated Provider Care Teams.  These Care Teams include your primary Cardiologist (physician) and Advanced Practice Providers (APPs -  Physician Assistants and Nurse Practitioners) who all work together to provide you with the care you need, when you need it.  We recommend signing up for the patient portal called "MyChart".  Sign up information is provided on this After Visit Summary.  MyChart is used to connect with patients for Virtual Visits (Telemedicine).  Patients are able to view lab/test results, encounter notes, upcoming appointments, etc.  Non-urgent messages can be sent to your provider as well.   To learn more about what you can do with MyChart, go to [email or web address].    Your next appointment:   As needed  The format for your next appointment:   In Person  Provider:   Buford Dresser, MD     [place]: Website: [email or web address]  DR. Harrell Gave RECOMMENDS YOU PURCHASE  " Kardia" [employer] FROM THE  [employer]  APP PLAY STORE.  THE APP IS FREE , BUT THE  EQUIPMENT HAS A COST. IT ALLOWS YOU TO OBTAIN A RECORDING OF YOUR HEART RATE AND RHYTHM BY PROVIDING A SHORT STRIP THAT YOU CAN SHARE WITH YOUR PROVIDER.

## 2022-01-15 ENCOUNTER — Encounter (HOSPITAL_BASED_OUTPATIENT_CLINIC_OR_DEPARTMENT_OTHER): Payer: Self-pay | Admitting: Cardiology

## 2022-02-22 LAB — OB RESULTS CONSOLE GC/CHLAMYDIA
Chlamydia: NEGATIVE
Neisseria Gonorrhea: NEGATIVE

## 2022-02-22 LAB — OB RESULTS CONSOLE ABO/RH: RH Type: POSITIVE

## 2022-02-22 LAB — OB RESULTS CONSOLE HIV ANTIBODY (ROUTINE TESTING): HIV: NONREACTIVE

## 2022-02-22 LAB — OB RESULTS CONSOLE ANTIBODY SCREEN: Antibody Screen: NEGATIVE

## 2022-02-22 LAB — OB RESULTS CONSOLE RUBELLA ANTIBODY, IGM: Rubella: IMMUNE

## 2022-02-22 LAB — OB RESULTS CONSOLE RPR: RPR: NONREACTIVE

## 2022-02-22 LAB — OB RESULTS CONSOLE HEPATITIS B SURFACE ANTIGEN: Hepatitis B Surface Ag: NEGATIVE

## 2022-02-22 LAB — HEPATITIS C ANTIBODY: HCV Ab: NEGATIVE

## 2022-09-01 LAB — OB RESULTS CONSOLE GBS: GBS: NEGATIVE

## 2022-09-14 ENCOUNTER — Encounter (HOSPITAL_COMMUNITY): Payer: Self-pay

## 2022-09-14 ENCOUNTER — Encounter (HOSPITAL_COMMUNITY): Payer: Self-pay | Admitting: *Deleted

## 2022-09-14 NOTE — Patient Instructions (Signed)
Grace Zhang  09/14/2022   Your procedure is scheduled on:  09/21/2022  Arrive at 0530 at Entrance C on [address] at [facility]  and [employer]. You are invited to use the FREE valet parking or use the Visitor's parking deck.  Pick up the phone at the desk and dial [phone].  Call this number if you have problems the morning of surgery: [phone]  Remember:   Do not eat food:(After Midnight) Desps de medianoche.  Do not drink clear liquids: (After Midnight) Desps de medianoche.  Take these medicines the morning of surgery with A SIP OF WATER:  none   Do not wear jewelry, make-up or nail polish.  Do not wear lotions, powders, or perfumes. Do not wear deodorant.  Do not shave 48 hours prior to surgery.  Do not bring valuables to the hospital.  [facility] is not   responsible for any belongings or valuables brought to the hospital.  Contacts, dentures or bridgework may not be worn into surgery.  Leave suitcase in the car. After surgery it may be brought to your room.  For patients admitted to the hospital, checkout time is 11:00 AM the day of              discharge.      Please read over the following fact sheets that you were given:     Preparing for Surgery

## 2022-09-19 NOTE — Anesthesia Preprocedure Evaluation (Addendum)
Anesthesia Evaluation  Patient identified by MRN, date of birth, ID band Patient awake    Reviewed: Allergy & Precautions, NPO status , Patient's Chart, lab work & pertinent test results  History of Anesthesia Complications Negative for: history of anesthetic complications  Airway Mallampati: II  TM Distance: >3 FB Neck ROM: Full    Dental no notable dental hx.    Pulmonary neg pulmonary ROS   Pulmonary exam normal        Cardiovascular negative cardio ROS Normal cardiovascular exam     Neuro/Psych  Headaches  negative psych ROS   GI/Hepatic negative GI ROS, Neg liver ROS,,,  Endo/Other  negative endocrine ROS    Renal/GU negative Renal ROS  negative genitourinary   Musculoskeletal negative musculoskeletal ROS (+)    Abdominal   Peds  Hematology negative hematology ROS (+)   Anesthesia Other Findings Day of surgery medications reviewed with patient.  Reproductive/Obstetrics (+) Pregnancy (Hx of C/S x2)                              Anesthesia Physical Anesthesia Plan  ASA: 3  Anesthesia Plan: Spinal   Post-op Pain Management: Ofirmev IV (intra-op)*   Induction:   PONV Risk Score and Plan: 4 or greater and Treatment may vary due to age or medical condition, Ondansetron and Dexamethasone  Airway Management Planned: Natural Airway  Additional Equipment: None  Intra-op Plan:   Post-operative Plan:   Informed Consent: I have reviewed the patients History and Physical, chart, labs and discussed the procedure including the risks, benefits and alternatives for the proposed anesthesia with the patient or authorized representative who has indicated his/her understanding and acceptance.       Plan Discussed with: CRNA  Anesthesia Plan Comments:         Anesthesia Quick Evaluation

## 2022-09-20 ENCOUNTER — Encounter (HOSPITAL_COMMUNITY): Payer: Self-pay | Admitting: Obstetrics and Gynecology

## 2022-09-20 ENCOUNTER — Encounter (HOSPITAL_COMMUNITY)
Admission: RE | Admit: 2022-09-20 | Discharge: 2022-09-20 | Disposition: A | Discharge status: Home / Self Care | Payer: BC Managed Care – PPO | Source: Ambulatory Visit | Attending: Obstetrics and Gynecology | Admitting: Obstetrics and Gynecology

## 2022-09-20 DIAGNOSIS — Z98891 History of uterine scar from previous surgery: Secondary | ICD-10-CM | POA: Insufficient documentation

## 2022-09-20 DIAGNOSIS — Z3A39 39 weeks gestation of pregnancy: Secondary | ICD-10-CM | POA: Insufficient documentation

## 2022-09-20 LAB — CBC
HCT: 37.5 % (ref 36.0–46.0)
Hemoglobin: 12.7 g/dL (ref 12.0–15.0)
MCH: 31.9 pg (ref 26.0–34.0)
MCHC: 33.9 g/dL (ref 30.0–36.0)
MCV: 94.2 fL (ref 80.0–100.0)
Platelets: 111 10*3/uL — ABNORMAL LOW (ref 150–400)
RBC: 3.98 MIL/uL (ref 3.87–5.11)
RDW: 11.9 % (ref 11.5–15.5)
WBC: 7.1 10*3/uL (ref 4.0–10.5)
nRBC: 0 % (ref 0.0–0.2)

## 2022-09-20 LAB — TYPE AND SCREEN
ABO/RH(D): O POS
Antibody Screen: NEGATIVE

## 2022-09-21 ENCOUNTER — Encounter (HOSPITAL_COMMUNITY)
Admission: AD | Disposition: A | Discharge status: Home / Self Care | Payer: Self-pay | Source: Home / Self Care | Attending: Obstetrics and Gynecology

## 2022-09-21 ENCOUNTER — Other Ambulatory Visit: Payer: Self-pay

## 2022-09-21 ENCOUNTER — Inpatient Hospital Stay (HOSPITAL_COMMUNITY)
Admission: AD | Admit: 2022-09-21 | Discharge: 2022-09-23 | DRG: 787 | Disposition: A | Discharge status: Home / Self Care | Payer: BC Managed Care – PPO | Attending: Obstetrics and Gynecology | Admitting: Obstetrics and Gynecology

## 2022-09-21 ENCOUNTER — Encounter (HOSPITAL_COMMUNITY): Payer: Self-pay | Admitting: Obstetrics and Gynecology

## 2022-09-21 ENCOUNTER — Inpatient Hospital Stay (HOSPITAL_COMMUNITY): Payer: BC Managed Care – PPO | Admitting: Anesthesiology

## 2022-09-21 DIAGNOSIS — Z3A39 39 weeks gestation of pregnancy: Secondary | ICD-10-CM | POA: Diagnosis not present

## 2022-09-21 DIAGNOSIS — O34211 Maternal care for low transverse scar from previous cesarean delivery: Principal | ICD-10-CM | POA: Diagnosis present

## 2022-09-21 DIAGNOSIS — R21 Rash and other nonspecific skin eruption: Secondary | ICD-10-CM | POA: Diagnosis not present

## 2022-09-21 DIAGNOSIS — O9912 Other diseases of the blood and blood-forming organs and certain disorders involving the immune mechanism complicating childbirth: Secondary | ICD-10-CM | POA: Diagnosis present

## 2022-09-21 DIAGNOSIS — D696 Thrombocytopenia, unspecified: Secondary | ICD-10-CM | POA: Diagnosis present

## 2022-09-21 DIAGNOSIS — Z98891 History of uterine scar from previous surgery: Secondary | ICD-10-CM

## 2022-09-21 DIAGNOSIS — O99893 Other specified diseases and conditions complicating puerperium: Secondary | ICD-10-CM | POA: Diagnosis not present

## 2022-09-21 LAB — RPR: RPR Ser Ql: NONREACTIVE

## 2022-09-21 SURGERY — Surgical Case
Anesthesia: Spinal

## 2022-09-21 MED ORDER — DIPHENHYDRAMINE HCL 25 MG PO CAPS
25.0000 mg | ORAL_CAPSULE | Freq: Four times a day (QID) | ORAL | Status: DC | PRN
  Administered 2022-09-22: 25 mg via ORAL
  Filled 2022-09-21: qty 1

## 2022-09-21 MED ORDER — KETOROLAC TROMETHAMINE 30 MG/ML IJ SOLN: 30.0000 mg | Freq: Four times a day (QID) | INTRAMUSCULAR | Status: DC | PRN

## 2022-09-21 MED ORDER — TRANEXAMIC ACID-NACL 1000-0.7 MG/100ML-% IV SOLN
INTRAVENOUS | Status: AC
  Filled 2022-09-21: qty 100

## 2022-09-21 MED ORDER — ACETAMINOPHEN 500 MG PO TABS
1000.0000 mg | ORAL_TABLET | Freq: Four times a day (QID) | ORAL | Status: DC
  Administered 2022-09-21 – 2022-09-23 (×6): 1000 mg via ORAL
  Filled 2022-09-21 (×6): qty 2

## 2022-09-21 MED ORDER — ONDANSETRON HCL 4 MG/2ML IJ SOLN
INTRAMUSCULAR | Status: DC | PRN
  Administered 2022-09-21: 4 mg via INTRAVENOUS

## 2022-09-21 MED ORDER — KETOROLAC TROMETHAMINE 30 MG/ML IJ SOLN
INTRAMUSCULAR | Status: AC
  Filled 2022-09-21: qty 1

## 2022-09-21 MED ORDER — WITCH HAZEL-GLYCERIN EX PADS: 1.0000 | MEDICATED_PAD | CUTANEOUS | Status: DC | PRN

## 2022-09-21 MED ORDER — KETOROLAC TROMETHAMINE 30 MG/ML IJ SOLN
30.0000 mg | Freq: Once | INTRAMUSCULAR | Status: AC
  Administered 2022-09-21: 30 mg via INTRAVENOUS

## 2022-09-21 MED ORDER — DIPHENHYDRAMINE HCL 25 MG PO CAPS: 25.0000 mg | ORAL_CAPSULE | ORAL | Status: DC | PRN

## 2022-09-21 MED ORDER — TRANEXAMIC ACID-NACL 1000-0.7 MG/100ML-% IV SOLN
1000.0000 mg | Freq: Once | INTRAVENOUS | Status: AC
  Administered 2022-09-21: 1000 mg via INTRAVENOUS

## 2022-09-21 MED ORDER — ONDANSETRON HCL 4 MG/2ML IJ SOLN
INTRAMUSCULAR | Status: AC
  Filled 2022-09-21: qty 2

## 2022-09-21 MED ORDER — PRENATAL MULTIVITAMIN CH
1.0000 | ORAL_TABLET | Freq: Every day | ORAL | Status: DC
  Administered 2022-09-21 – 2022-09-23 (×3): 1 via ORAL
  Filled 2022-09-21 (×3): qty 1

## 2022-09-21 MED ORDER — CEFAZOLIN SODIUM-DEXTROSE 2-4 GM/100ML-% IV SOLN
2.0000 g | INTRAVENOUS | Status: AC
  Administered 2022-09-21: 2 g via INTRAVENOUS

## 2022-09-21 MED ORDER — METOCLOPRAMIDE HCL 5 MG/ML IJ SOLN
INTRAMUSCULAR | Status: DC | PRN
  Administered 2022-09-21: 10 mg via INTRAVENOUS

## 2022-09-21 MED ORDER — OXYTOCIN-SODIUM CHLORIDE 30-0.9 UT/500ML-% IV SOLN
INTRAVENOUS | Status: AC
  Filled 2022-09-21: qty 500

## 2022-09-21 MED ORDER — ACETAMINOPHEN 500 MG PO TABS
1000.0000 mg | ORAL_TABLET | ORAL | Status: AC
  Administered 2022-09-21: 1000 mg via ORAL

## 2022-09-21 MED ORDER — CEFAZOLIN SODIUM-DEXTROSE 2-4 GM/100ML-% IV SOLN
INTRAVENOUS | Status: AC
  Filled 2022-09-21: qty 100

## 2022-09-21 MED ORDER — COCONUT OIL OIL: 1.0000 | TOPICAL_OIL | Status: DC | PRN

## 2022-09-21 MED ORDER — BUPIVACAINE IN DEXTROSE 0.75-8.25 % IT SOLN
INTRATHECAL | Status: DC | PRN
  Administered 2022-09-21: 1.6 mL via INTRATHECAL

## 2022-09-21 MED ORDER — SOD CITRATE-CITRIC ACID 500-334 MG/5ML PO SOLN
ORAL | Status: AC
  Filled 2022-09-21: qty 30

## 2022-09-21 MED ORDER — ONDANSETRON HCL 4 MG/2ML IJ SOLN: 4.0000 mg | Freq: Three times a day (TID) | INTRAMUSCULAR | Status: DC | PRN

## 2022-09-21 MED ORDER — MORPHINE SULFATE (PF) 0.5 MG/ML IJ SOLN
INTRAMUSCULAR | Status: DC | PRN
  Administered 2022-09-21: .15 mg via INTRATHECAL

## 2022-09-21 MED ORDER — FENTANYL CITRATE (PF) 100 MCG/2ML IJ SOLN
INTRAMUSCULAR | Status: AC
  Filled 2022-09-21: qty 2

## 2022-09-21 MED ORDER — OXYCODONE HCL 5 MG PO TABS
5.0000 mg | ORAL_TABLET | ORAL | Status: DC | PRN
  Administered 2022-09-22 – 2022-09-23 (×2): 5 mg via ORAL
  Filled 2022-09-21 (×2): qty 1

## 2022-09-21 MED ORDER — ACETAMINOPHEN 500 MG PO TABS
ORAL_TABLET | ORAL | Status: AC
  Filled 2022-09-21: qty 2

## 2022-09-21 MED ORDER — NALOXONE HCL 4 MG/10ML IJ SOLN: 1.0000 ug/kg/h | INTRAVENOUS | Status: DC | PRN

## 2022-09-21 MED ORDER — MENTHOL 3 MG MT LOZG: 1.0000 | LOZENGE | OROMUCOSAL | Status: DC | PRN

## 2022-09-21 MED ORDER — SODIUM CHLORIDE 0.9 % IV SOLN: INTRAVENOUS | Status: DC | PRN

## 2022-09-21 MED ORDER — TRAMADOL HCL 50 MG PO TABS: 50.0000 mg | ORAL_TABLET | Freq: Four times a day (QID) | ORAL | Status: DC | PRN

## 2022-09-21 MED ORDER — DIPHENHYDRAMINE HCL 50 MG/ML IJ SOLN
12.5000 mg | INTRAMUSCULAR | Status: DC | PRN
  Administered 2022-09-21: 12.5 mg via INTRAVENOUS
  Filled 2022-09-21: qty 1

## 2022-09-21 MED ORDER — PHENYLEPHRINE 80 MCG/ML (10ML) SYRINGE FOR IV PUSH (FOR BLOOD PRESSURE SUPPORT)
PREFILLED_SYRINGE | INTRAVENOUS | Status: AC
  Filled 2022-09-21: qty 10

## 2022-09-21 MED ORDER — IBUPROFEN 600 MG PO TABS
600.0000 mg | ORAL_TABLET | Freq: Four times a day (QID) | ORAL | Status: DC
  Administered 2022-09-22 – 2022-09-23 (×5): 600 mg via ORAL
  Filled 2022-09-21 (×5): qty 1

## 2022-09-21 MED ORDER — SENNOSIDES-DOCUSATE SODIUM 8.6-50 MG PO TABS
2.0000 | ORAL_TABLET | Freq: Every day | ORAL | Status: DC
  Administered 2022-09-22 – 2022-09-23 (×2): 2 via ORAL
  Filled 2022-09-21: qty 2

## 2022-09-21 MED ORDER — LACTATED RINGERS IV SOLN: INTRAVENOUS | Status: DC

## 2022-09-21 MED ORDER — ACETAMINOPHEN 10 MG/ML IV SOLN
INTRAVENOUS | Status: DC | PRN
  Administered 2022-09-21: 1000 mg via INTRAVENOUS

## 2022-09-21 MED ORDER — NALOXONE HCL 0.4 MG/ML IJ SOLN: 0.4000 mg | INTRAMUSCULAR | Status: DC | PRN

## 2022-09-21 MED ORDER — METOCLOPRAMIDE HCL 5 MG/ML IJ SOLN
INTRAMUSCULAR | Status: AC
  Filled 2022-09-21: qty 2

## 2022-09-21 MED ORDER — SODIUM CHLORIDE 0.9% FLUSH: 3.0000 mL | INTRAVENOUS | Status: DC | PRN

## 2022-09-21 MED ORDER — PHENYLEPHRINE HCL (PRESSORS) 10 MG/ML IV SOLN
INTRAVENOUS | Status: DC | PRN
  Administered 2022-09-21: 160 ug via INTRAVENOUS

## 2022-09-21 MED ORDER — SIMETHICONE 80 MG PO CHEW: 80.0000 mg | CHEWABLE_TABLET | ORAL | Status: DC | PRN

## 2022-09-21 MED ORDER — OXYTOCIN-SODIUM CHLORIDE 30-0.9 UT/500ML-% IV SOLN
INTRAVENOUS | Status: DC | PRN
  Administered 2022-09-21: 30 [IU] via INTRAVENOUS

## 2022-09-21 MED ORDER — ACETAMINOPHEN 500 MG PO TABS: 1000.0000 mg | ORAL_TABLET | Freq: Four times a day (QID) | ORAL | Status: DC

## 2022-09-21 MED ORDER — ZOLPIDEM TARTRATE 5 MG PO TABS: 5.0000 mg | ORAL_TABLET | Freq: Every evening | ORAL | Status: DC | PRN

## 2022-09-21 MED ORDER — DEXAMETHASONE SODIUM PHOSPHATE 4 MG/ML IJ SOLN
INTRAMUSCULAR | Status: AC
  Filled 2022-09-21: qty 1

## 2022-09-21 MED ORDER — POVIDONE-IODINE 10 % EX SWAB
2.0000 | Freq: Once | CUTANEOUS | Status: AC
  Administered 2022-09-21: 2 via TOPICAL

## 2022-09-21 MED ORDER — FENTANYL CITRATE (PF) 100 MCG/2ML IJ SOLN
INTRAMUSCULAR | Status: DC | PRN
  Administered 2022-09-21: 15 ug via INTRATHECAL

## 2022-09-21 MED ORDER — SIMETHICONE 80 MG PO CHEW
80.0000 mg | CHEWABLE_TABLET | Freq: Three times a day (TID) | ORAL | Status: DC
  Administered 2022-09-21 – 2022-09-23 (×7): 80 mg via ORAL
  Filled 2022-09-21 (×6): qty 1

## 2022-09-21 MED ORDER — MORPHINE SULFATE (PF) 0.5 MG/ML IJ SOLN
INTRAMUSCULAR | Status: AC
  Filled 2022-09-21: qty 10

## 2022-09-21 MED ORDER — EPHEDRINE 5 MG/ML INJ
INTRAVENOUS | Status: AC
  Filled 2022-09-21: qty 5

## 2022-09-21 MED ORDER — HYDROCORTISONE 0.5 % EX CREA
TOPICAL_CREAM | Freq: Two times a day (BID) | CUTANEOUS | Status: DC
  Filled 2022-09-21: qty 28.35

## 2022-09-21 MED ORDER — DROPERIDOL 2.5 MG/ML IJ SOLN: 0.6250 mg | Freq: Once | INTRAMUSCULAR | Status: DC | PRN

## 2022-09-21 MED ORDER — EPHEDRINE SULFATE (PRESSORS) 50 MG/ML IJ SOLN
INTRAMUSCULAR | Status: DC | PRN
  Administered 2022-09-21 (×3): 5 mg via INTRAVENOUS

## 2022-09-21 MED ORDER — ACETAMINOPHEN 10 MG/ML IV SOLN
INTRAVENOUS | Status: AC
  Filled 2022-09-21: qty 100

## 2022-09-21 MED ORDER — FENTANYL CITRATE (PF) 100 MCG/2ML IJ SOLN: 25.0000 ug | INTRAMUSCULAR | Status: DC | PRN

## 2022-09-21 MED ORDER — KETOROLAC TROMETHAMINE 30 MG/ML IJ SOLN
30.0000 mg | Freq: Four times a day (QID) | INTRAMUSCULAR | Status: AC
  Administered 2022-09-21 – 2022-09-22 (×3): 30 mg via INTRAVENOUS
  Filled 2022-09-21 (×3): qty 1

## 2022-09-21 MED ORDER — OXYTOCIN-SODIUM CHLORIDE 30-0.9 UT/500ML-% IV SOLN: 2.5000 [IU]/h | INTRAVENOUS | Status: DC

## 2022-09-21 MED ORDER — PHENYLEPHRINE HCL-NACL 20-0.9 MG/250ML-% IV SOLN
INTRAVENOUS | Status: AC
  Filled 2022-09-21: qty 250

## 2022-09-21 MED ORDER — PHENYLEPHRINE HCL-NACL 20-0.9 MG/250ML-% IV SOLN
INTRAVENOUS | Status: DC | PRN
  Administered 2022-09-21: 60 ug/min via INTRAVENOUS

## 2022-09-21 MED ORDER — DIBUCAINE (PERIANAL) 1 % EX OINT: 1.0000 | TOPICAL_OINTMENT | CUTANEOUS | Status: DC | PRN

## 2022-09-21 SURGICAL SUPPLY — 38 items
ADH SKN CLS APL DERMABOND .7 (GAUZE/BANDAGES/DRESSINGS) ×1
APL PRP STRL LF DISP 70% ISPRP (MISCELLANEOUS) ×2
CHLORAPREP W/TINT 26 (MISCELLANEOUS) ×2 IMPLANT
CLAMP UMBILICAL CORD (MISCELLANEOUS) ×1 IMPLANT
CLOTH BEACON ORANGE TIMEOUT ST (SAFETY) ×1 IMPLANT
DERMABOND ADVANCED .7 DNX12 (GAUZE/BANDAGES/DRESSINGS) ×1 IMPLANT
DRSG OPSITE POSTOP 4X10 (GAUZE/BANDAGES/DRESSINGS) ×1 IMPLANT
DRSG OPSITE POSTOP 4X8 (GAUZE/BANDAGES/DRESSINGS) IMPLANT
ELECT REM PT RETURN 9FT ADLT (ELECTROSURGICAL) ×1
ELECTRODE REM PT RTRN 9FT ADLT (ELECTROSURGICAL) ×1 IMPLANT
EXTRACTOR VACUUM BELL STYLE (SUCTIONS) IMPLANT
GAUZE SPONGE 4X4 12PLY STRL LF (GAUZE/BANDAGES/DRESSINGS) IMPLANT
GLOVE BIO SURGEON STRL SZ 6.5 (GLOVE) ×1 IMPLANT
GLOVE BIOGEL PI IND STRL 6.5 (GLOVE) ×1 IMPLANT
GLOVE BIOGEL PI IND STRL 7.0 (GLOVE) ×2 IMPLANT
GOWN STRL REUS W/TWL LRG LVL3 (GOWN DISPOSABLE) ×2 IMPLANT
HEMOSTAT ARISTA ABSORB 3G PWDR (HEMOSTASIS) IMPLANT
KIT ABG SYR 3ML LUER SLIP (SYRINGE) IMPLANT
NDL HYPO 25X5/8 SAFETYGLIDE (NEEDLE) IMPLANT
NEEDLE HYPO 25X5/8 SAFETYGLIDE (NEEDLE) IMPLANT
NS IRRIG 1000ML POUR BTL (IV SOLUTION) ×1 IMPLANT
PACK C SECTION WH (CUSTOM PROCEDURE TRAY) ×1 IMPLANT
PAD ABD 8X10 STRL (GAUZE/BANDAGES/DRESSINGS) IMPLANT
PAD OB MATERNITY 4.3X12.25 (PERSONAL CARE ITEMS) ×1 IMPLANT
RTRCTR C-SECT PINK 25CM LRG (MISCELLANEOUS) IMPLANT
STRIP CLOSURE SKIN 1/2X4 (GAUZE/BANDAGES/DRESSINGS) IMPLANT
SUT PDS AB 0 CTX 36 PDP370T (SUTURE) IMPLANT
SUT PLAIN 2 0 (SUTURE)
SUT PLAIN ABS 2-0 CT1 27XMFL (SUTURE) IMPLANT
SUT VIC AB 0 CT1 36 (SUTURE) ×2 IMPLANT
SUT VIC AB 2-0 CT1 27 (SUTURE) ×1
SUT VIC AB 2-0 CT1 TAPERPNT 27 (SUTURE) ×1 IMPLANT
SUT VIC AB 3-0 SH 27 (SUTURE)
SUT VIC AB 3-0 SH 27X BRD (SUTURE) IMPLANT
SUT VIC AB 4-0 KS 27 (SUTURE) ×1 IMPLANT
TOWEL OR 17X24 6PK STRL BLUE (TOWEL DISPOSABLE) ×1 IMPLANT
TRAY FOLEY W/BAG SLVR 14FR LF (SET/KITS/TRAYS/PACK) ×1 IMPLANT
WATER STERILE IRR 1000ML POUR (IV SOLUTION) ×1 IMPLANT

## 2022-09-21 NOTE — Lactation Note (Signed)
This note was copied from a baby's chart. Lactation Consultation Note  Patient Name: Grace Zhang [id number] Date: 09/21/2022 Age:34 hours Reason for consult: Initial assessment  P3, Experienced with breastfeeding her other two children for 16 mos.  Baby has latched x 3 since birth.  Encouraged offering both breasts per feeding.  Feed on demand with cues.  Goal 8-12+ times per day after first 24 hrs.  Place baby STS if not cueing.  Mom made aware of O/P services, breastfeeding support groups and our phone # for post-discharge questions.   Maternal Data Has patient been taught Hand Expression?: Yes Does the patient have breastfeeding experience prior to this delivery?: Yes How long did the patient breastfeed?: 16 mos.  Feeding Mother's Current Feeding Choice: Breast Milk  Interventions Interventions: Education;Breast feeding basics reviewed;LC Services brochure Consult Status Consult Status: Follow-up Date: 09/22/22 Follow-up type: In-patient    Dahlia Byes [facility] 09/21/2022, 1:03 PM

## 2022-09-21 NOTE — H&P (Signed)
Grace Zhang is a 32 y.o. female presenting for scheduled section. +FM, denies VB, LOF, CTX.  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 Had genetic counseling in past  Had reaction to either adhesive on drape or abdominal prep with first child - it was changed in second delivery and was better --- wants same as second delivery-steri-strips, not dermabond. GBS neg  OB History     Gravida  3   Para  2   Term  1   Preterm  0   AB  0   Living  2      SAB  0   IAB  0   Ectopic  0   Multiple  0   Live Births  1          Past Medical History:  Diagnosis Date   Acne - sees dermatologist 11/08/2013   Breech presentation 04/24/2018   Frequent headaches    S/P repeat low transverse C-section 06/09/2020   Past Surgical History:  Procedure Laterality Date   BUNIONECTOMY Right 01/2013   CESAREAN SECTION N/A 05/03/2018   Procedure: CESAREAN SECTION;  Surgeon: Huel Cote, MD;  Location: [facility] BIRTHING SUITES;  Service: Obstetrics;  Laterality: N/A;  MD Request RNFA   CESAREAN SECTION N/A 06/09/2020   Procedure: Repeat CESAREAN SECTION;  Surgeon: Huel Cote, MD;  Location: [facility] LD ORS;  Service: Obstetrics;  Laterality: N/A;  request RNFA if one by then   EYE SURGERY     REFRACTIVE SURGERY     WISDOM TOOTH EXTRACTION  2008   Family History: family history includes Arthritis in her maternal grandmother; Basal cell carcinoma in her maternal aunt; Cancer in her maternal grandfather; Hypertension in her maternal aunt and maternal grandfather; Interstitial cystitis in her mother; Osteoporosis in her maternal grandmother; Stroke in her maternal grandfather; Thyroid disease in her mother. Social History:  reports that she has never smoked. She has never used smokeless tobacco. She reports that she does  not currently use alcohol. She reports that she does not use drugs.     Maternal Diabetes: No1hr 147 - FSBG x2wks WNL Genetic Screening: See above Maternal Ultrasounds/Referrals: Normal Fetal Ultrasounds or other Referrals:  None Maternal Substance Abuse:  No Significant Maternal Medications:  None Significant Maternal Lab Results:  Group B Strep negative Number of Prenatal Visits:greater than 3 verified prenatal visits Other Comments:  None  Review of Systems  Constitutional:  Negative for chills and fever.  Respiratory:  Negative for shortness of breath.   Cardiovascular:  Negative for chest pain, palpitations and leg swelling.  Gastrointestinal:  Negative for abdominal pain and vomiting.  Neurological:  Negative for dizziness, weakness and headaches.  Psychiatric/Behavioral:  Negative for suicidal ideas.    Maternal Medical History:  Fetal activity: Perceived fetal activity is normal.   Prenatal complications: No PIH or IUGR.   Prenatal Complications - Diabetes: none.     Blood pressure 105/88, pulse 85, temperature 98.7 F (37.1 C), temperature source Oral, resp. rate 18, height 5\' 5"  (1.651 m), weight 61.6 kg, last menstrual period 11/25/2021, currently breastfeeding. Exam Physical Exam Constitutional:      General: She is not in acute distress.    Appearance: She is well-developed.  HENT:     Head: Normocephalic and atraumatic.  Eyes:     Pupils:  Pupils are equal, round, and reactive to light.  Cardiovascular:     Rate and Rhythm: Normal rate and regular rhythm.     Heart sounds: No murmur heard.    No gallop.  Abdominal:     Tenderness: There is no abdominal tenderness. There is no guarding or rebound.  Genitourinary:    Vagina: Normal.  Musculoskeletal:        General: Normal range of motion.     Cervical back: Normal range of motion and neck supple.  Skin:    General: Skin is warm and dry.  Neurological:     Mental Status: She is alert and oriented to  person, place, and time.     Prenatal labs: ABO, Rh: --/--/O POS (05/20 4098) Antibody: NEG (05/20 0955) Rubella: Immune (10/23 0000) RPR: Nonreactive (10/23 0000)  HBsAg: Negative (10/23 0000)  HIV: Non-reactive (10/23 0000)  GBS: Negative/-- (05/01 0000)   Assessment/Plan: This is a 31yo G3P2002 @ 39 0/7 by LMP c/w 8wk TVUS admitted for scheduled repeat section for history of section x2. GBS neg. Known reaction to prep/adhesive from first case, non-betadine options reviewed. Steri-strips planned. H/o PPH in G2 with 8lb14oz infant, TXA on deck. R/B/A of cesarean section discussed with patient. Alternative would be vaginal delivery which would mean shorter postpartum stay and decreased risk of bleeding. Risks of section include infection of the uterus, pelvic organs, or skin, inadvertent injury to internal organs, such as bowel or bladder. If there is major injury, extensive surgery may be required. If injury is minor, it may be treated with relative ease. Discussed possibility of excessive blood loss and transfusion. If bleeding cannot be controlled using medical or minor surgical methods, a cesarean hysterectomy may be performed which would mean no future fertility. Patient accepts the possibility of blood transfusion, if necessary. Patient understands and agrees to move forward with section. Of note, patient with possible small umbilical hernia, advised we can evaluate for intra-op repair but may note be possible. Patient understands   Carlisle Cater 09/21/2022, 7:25 AM

## 2022-09-21 NOTE — Progress Notes (Signed)
Patient assessment:   1) Reports bleeding from right hear. No loss of hearing, headache, ringing. Occurred 2000. Spontaneously closed. ON exam, TM intact and without evidence of infection/rupture. Small pinpoint spot of old blood in anterior canal - possible small vessel bleed, not active. No erythema, other discharge. Tolerated exam. OK to monitor 2) continue IV fluids at 75cc, foley until AM 3) Reviewed intra-op thin LUS and scarring prohibiting eval of possible umbilical hernia 4) Mild sternal discomfort - 4/10, akin to pressure, non-radiating, non-pleuritic, unsure of relation to food. CV: CTAB, RRR. NTTP throughout anterior chest wall BP (!) 94/58 (BP Location: Right Arm)   Pulse 60   Temp 98.3 F (36.8 C) (Oral)   Resp 16   Ht 5\' 5"  (1.651 m)   Wt 61.6 kg   LMP 11/25/2021 (Exact Date)   SpO2 96%   Breastfeeding Unknown   BMI 22.61 kg/m  Low concern for pulm or CV process at this time. Patient states it is slowly resolving.  5) rash on right side of abdomen - not c/w prior abdominal prep, three horizontal 1cm mildly raised and erythematous lines, mild urticaria. Topical hydrocortisone ordered. Not c/w PUPPS (not striae involvement), low concern for adhesive reaction (not within boundaries of drape)

## 2022-09-21 NOTE — Anesthesia Procedure Notes (Signed)
Spinal  Patient location during procedure: OR Start time: 09/21/2022 7:39 AM End time: 09/21/2022 7:42 AM Reason for block: surgical anesthesia Staffing Performed: anesthesiologist  Anesthesiologist: Kaylyn Layer, MD Performed by: Kaylyn Layer, MD Authorized by: Kaylyn Layer, MD   Preanesthetic Checklist Completed: patient identified, IV checked, risks and benefits discussed, monitors and equipment checked, pre-op evaluation and timeout performed Spinal Block Patient position: sitting Prep: DuraPrep and site prepped and draped Patient monitoring: heart rate, continuous pulse ox and blood pressure Approach: midline Location: L3-4 Injection technique: single-shot Needle Needle type: Pencan  Needle gauge: 24 G Needle length: 10 cm Assessment Sensory level: T4 Events: CSF return Additional Notes Risks, benefits, and alternative discussed. Patient gave consent to procedure. Prepped and draped in sitting position. Clear CSF obtained after one needle pass. Positive terminal aspiration. No pain or paraesthesias with injection. Patient tolerated procedure well. Vital signs stable. Amalia Greenhouse, MD

## 2022-09-21 NOTE — Op Note (Signed)
C-Section Operative Note  Date: 09/21/22  Preoperative Diagnosis: IUP @ 39 0/7 , history of PPH Postoperative Diagnosis: same as above Procedure: ERLTCS Indication: history of section x2 Findings: Viable female infant weighing 7lb10z with APGARS of 7, 8  and 9 at 1, 5 and 10 minutes, respectively. Normal appearing uterus, bilateral fallopian tubes and ovaries. Of note, very thin LUS (approx 3-18mm). Fascial scarring evident between rectus bellies and fascia superiorly.   Specimens: placenta to L&D EBL 647 IVF 1L UOP 200cc  Consent:  R/B/A of cesarean section discussed with patient. Alternative would be vaginal delivery which would mean shorter postpartum stay and decreased risk of bleeding. Risks of section include infection of the uterus, pelvic organs, or skin, inadvertent injury to internal organs, such as bowel or bladder. If there is major injury, extensive surgery may be required. If injury is minor, it may be treated with relative ease. Discussed possibility of excessive blood loss and transfusion. If bleeding cannot be controlled using medical or minor surgical methods, a cesarean hysterectomy may be performed which would mean no future fertility. Patient accepts the possibility of blood transfusion, if necessary. Patient understands and agrees to move forward with section.   Operative Procedure: Patient was taken to the operating room where epidural anesthesia was found to be adequate by Allis clamp test. She was prepped and draped in the normal sterile fashion in the dorsal supine position with a leftward tilt. An appropriate time out was performed. A Pfannenstiel skin incision was then made with the scalpel and carried through to the underlying layer of fascia by sharp dissection and Bovie cautery. The fascia was nicked in the midline and the incision was extended laterally with Mayo scissors. The superior aspect of the incision was grasped Coker clamps and dissected off the underlying  rectus muscles. Time was taken during this step given evidence of scarring consistent with prior section x2, Bovie cautery ensured adequate hemostasis. Unable to dissect safely up to umbilicus to investigate any possible umbilical hernia, patient was informed during procedure. In a similar fashion the inferior aspect was dissected off the rectus muscles. Rectus muscles were separated in the midline and the peritoneal cavity entered using hemostats x2 and bluntly. The peritoneal incision was then extended both superiorly and inferiorly with careful attention to avoid both bowel and bladder. The Alexis self-retaining wound retractor was then placed within the incision and the lower uterine segment exposed. The bladder flap was developed with Metzenbaum scissors and pushed away from the lower uterine segment. The lower uterine segment was then incised in a low transverse fashion and the cavity itself entered bluntly - again, very thin LUS with only one incision required to meet amniotic sac, no trauma to baby noted. The incision was extended bluntly. Amniotic sac was ruptured and fluid was noted to be clear in color. The infant's head was then lifted and delivered from the incision without difficulty using the standard movements. The remainder of the infant delivered and the nose and mouth bulb suctioned with the cord clamped and cut as well. The infant was handed off to NICU. Initially required BBO2 and CPAP but then was doing well per team. The placenta was then spontaneously expressed from the uterus and the uterus cleared of all clots and debris with moist lap sponge. The uterine incision was then repaired in 2 layers the first layer was a running locked layer of 0-vicryl and the second an imbricating layer of the same suture. The tubes and ovaries were inspected  and the gutters cleared of all clots and debris. The uterine incision was inspected and found to be hemostatic. All instruments and sponges as well as the  Alexis retractor were then removed from the abdomen.  The rectus muscles were then reapproximated with several interrupted mattress sutures of 2-0 Vicryl. The fascia was then closed with 0 Vicryl in a running fashion. The skin was closed with a subcuticular stitch of 4-0 Vicryl on a Keith needle and then reinforced with Steristrips (no Benzoin used per patient request given history of allergic reaction to unknown adhesive). As non was used, a pressure dressing was applied over top. At the conclusion of the procedure all instruments and sponge counts were correct. Patient was taken to the recovery room in good condition with her baby accompanying her skin to skin.

## 2022-09-21 NOTE — Transfer of Care (Signed)
Immediate Anesthesia Transfer of Care Note  Patient: Grace Zhang  Procedure(s) Performed: CESAREAN SECTION  Patient Location: PACU  Anesthesia Type:Spinal  Level of Consciousness: awake, alert , and oriented  Airway & Oxygen Therapy: Patient Spontanous Breathing  Post-op Assessment: Report given to RN and Post -op Vital signs reviewed and stable  Post vital signs: Reviewed and stable  Last Vitals:  Vitals Value Taken Time  BP 109/77 09/21/22 0910  Temp    Pulse 74 09/21/22 0911  Resp 19 09/21/22 0911  SpO2 83 % 09/21/22 0911  Vitals shown include unvalidated device data.  Last Pain:  Vitals:   09/21/22 0601  TempSrc:   PainSc: 0-No pain         Complications: No notable events documented.

## 2022-09-21 NOTE — Anesthesia Postprocedure Evaluation (Signed)
Anesthesia Post Note  Patient: Grace Zhang  Procedure(s) Performed: CESAREAN SECTION     Patient location during evaluation: PACU Anesthesia Type: Spinal Level of consciousness: awake and alert Pain management: pain level controlled Vital Signs Assessment: post-procedure vital signs reviewed and stable Respiratory status: spontaneous breathing, nonlabored ventilation and respiratory function stable Cardiovascular status: blood pressure returned to baseline Postop Assessment: no apparent nausea or vomiting and spinal receding Anesthetic complications: no   No notable events documented.  Last Vitals:  Vitals:   09/21/22 0955 09/21/22 1014  BP: 105/61 104/63  Pulse: 61 (!) 56  Resp: 13 14  Temp:  36.5 C  SpO2: 97% 100%    Last Pain:  Vitals:   09/21/22 1126  TempSrc:   PainSc: 4    Pain Goal:                   Shanda Howells

## 2022-09-21 NOTE — Progress Notes (Signed)
Pt's skin reddened after removal of telemetry leads. Pt reports a little itchiness.

## 2022-09-22 LAB — CBC
HCT: 28.7 % — ABNORMAL LOW (ref 36.0–46.0)
Hemoglobin: 10 g/dL — ABNORMAL LOW (ref 12.0–15.0)
MCH: 32.2 pg (ref 26.0–34.0)
MCHC: 34.8 g/dL (ref 30.0–36.0)
MCV: 92.3 fL (ref 80.0–100.0)
Platelets: 68 10*3/uL — ABNORMAL LOW (ref 150–400)
RBC: 3.11 MIL/uL — ABNORMAL LOW (ref 3.87–5.11)
RDW: 12 % (ref 11.5–15.5)
WBC: 9.8 10*3/uL (ref 4.0–10.5)
nRBC: 0 % (ref 0.0–0.2)

## 2022-09-22 LAB — BIRTH TISSUE RECOVERY COLLECTION (PLACENTA DONATION)

## 2022-09-22 MED ORDER — LACTATED RINGERS IV SOLN: INTRAVENOUS | Status: DC

## 2022-09-22 MED ORDER — CLOBETASOL PROPIONATE 0.05 % EX CREA
TOPICAL_CREAM | Freq: Two times a day (BID) | CUTANEOUS | Status: DC
  Filled 2022-09-22: qty 15

## 2022-09-22 NOTE — Progress Notes (Signed)
Called Dr Tenny Craw for dressing after removing pressure dressing, platelet results- ok to give ibuprofen.

## 2022-09-22 NOTE — Progress Notes (Signed)
Subjective: Postpartum Day 1: Cesarean Delivery Patient reports pain controlled, no nausea or vomiting, ambulating without difficulty.  Voiding without difficulty.  Has developed a itchy rash on her abdomen.  Hydrocortisone 1% is not particularly helpful.  Objective: Vital signs in last 24 hours: Temp:  [98 F (36.7 C)-98.4 F (36.9 C)] 98 F (36.7 C) (05/22 0557) Pulse Rate:  [60-68] 60 (05/22 0557) Resp:  [16] 16 (05/22 0557) BP: (94-108)/(53-69) 108/69 (05/22 0557) SpO2:  [96 %-98 %] 98 % (05/22 0557)  Physical Exam:  General: alert, cooperative, and appears stated age Lochia: appropriate Uterine Fundus: firm, erythematous papules and plaques with some with central pustules. Incision: healing well DVT Evaluation: No evidence of DVT seen on physical exam.  Recent Labs    09/20/22 0955 09/22/22 0540  HGB 12.7 10.0*  HCT 37.5 28.7*    Assessment/Plan: Status post Cesarean section. Doing well postoperatively.  Continue current care. Will add clobetasol 0.05% cream twice daily.  Waynard Reeds, MD 09/22/2022, 1:43 PM

## 2022-09-22 NOTE — Progress Notes (Signed)
Patient complains of pressure in her mid chest. She rates the pain at a level 5 our of 10 on the 0-10 pain scale.  Patient also complains of a gush of blood came out of her right ear. She denies any ear pain.  Denies any sharp chest pain or shortness of breathe. MD notified and came to the floor to see the patient.  Will to continue to monitor.     Darrick Grinder, RN

## 2022-09-23 LAB — CBC WITH DIFFERENTIAL/PLATELET
Abs Immature Granulocytes: 0.04 10*3/uL (ref 0.00–0.07)
Basophils Absolute: 0 10*3/uL (ref 0.0–0.1)
Basophils Relative: 0 %
Eosinophils Absolute: 0.2 10*3/uL (ref 0.0–0.5)
Eosinophils Relative: 2 %
HCT: 30.5 % — ABNORMAL LOW (ref 36.0–46.0)
Hemoglobin: 10.4 g/dL — ABNORMAL LOW (ref 12.0–15.0)
Immature Granulocytes: 0 %
Lymphocytes Relative: 17 %
Lymphs Abs: 1.5 10*3/uL (ref 0.7–4.0)
MCH: 32.3 pg (ref 26.0–34.0)
MCHC: 34.1 g/dL (ref 30.0–36.0)
MCV: 94.7 fL (ref 80.0–100.0)
Monocytes Absolute: 0.3 10*3/uL (ref 0.1–1.0)
Monocytes Relative: 3 %
Neutro Abs: 7 10*3/uL (ref 1.7–7.7)
Neutrophils Relative %: 78 %
Platelets: 81 10*3/uL — ABNORMAL LOW (ref 150–400)
RBC: 3.22 MIL/uL — ABNORMAL LOW (ref 3.87–5.11)
RDW: 12.2 % (ref 11.5–15.5)
WBC: 9 10*3/uL (ref 4.0–10.5)
nRBC: 0 % (ref 0.0–0.2)

## 2022-09-23 MED ORDER — IBUPROFEN 600 MG PO TABS: 600.0000 mg | ORAL_TABLET | Freq: Four times a day (QID) | ORAL | 1 refills | Status: AC | PRN

## 2022-09-23 MED ORDER — OXYCODONE HCL 5 MG PO TABS: 5.0000 mg | ORAL_TABLET | ORAL | 0 refills | Status: DC | PRN

## 2022-09-23 MED ORDER — CALAMINE EX LOTN: 1.0000 | TOPICAL_LOTION | Freq: Two times a day (BID) | CUTANEOUS | 1 refills | Status: AC

## 2022-09-23 MED ORDER — OXYCODONE-ACETAMINOPHEN 5-325 MG PO TABS: 1.0000 | ORAL_TABLET | ORAL | 0 refills | Status: DC | PRN

## 2022-09-23 NOTE — Discharge Summary (Signed)
Postpartum Discharge Summary  Date of Service updated      Patient Name: Grace Zhang DOB: 08-08-1990 MRN: [id number]  Date of admission: 09/21/2022 Delivery date:09/21/2022  Delivering provider: Carlisle Cater  Date of discharge: 09/23/2022  Admitting diagnosis: History of cesarean section [Z98.891] Intrauterine pregnancy: [redacted]w[redacted]d     Secondary diagnosis:  Principal Problem:   History of cesarean section  Additional problems: thrombocytopenia    Discharge diagnosis: Term Pregnancy Delivered and allergic rash    and thrombocytopenia                                           Post partum procedures: n/a Augmentation: N/A Complications: None  Hospital course: Sceduled C/S   32 y.o. yo G3P2003 at [redacted]w[redacted]d was admitted to the hospital 09/21/2022 for scheduled cesarean section with the following indication:Elective Repeat.Delivery details are as follows:  Membrane Rupture Time/Date: 8:12 AM ,09/21/2022   Delivery Method:C-Section, Low Transverse  Details of operation can be found in separate operative note.  Patient had a postpartum course complicated by allergic abdominal rash.  She is ambulating, tolerating a regular diet, passing flatus, and urinating well. Patient is discharged home in stable condition on  09/23/22        Newborn Data: Birth date:09/21/2022  Birth time:8:12 AM  Gender:Female  Living status:Living  Apgars:7 ,8  Weight:3460 g     Magnesium Sulfate received: No BMZ received: No Physical exam  Vitals:   09/22/22 0557 09/22/22 1431 09/22/22 2142 09/23/22 0503  BP: 108/69 104/68 111/76 101/67  Pulse: 60 70  78  Resp: 16 16 16 16   Temp: 98 F (36.7 C) 98.1 F (36.7 C) 98.2 F (36.8 C) 97.9 F (36.6 C)  TempSrc: Oral Oral Oral Oral  SpO2: 98% 99%    Weight:      Height:       General: alert, cooperative, and no distress Lochia: appropriate Uterine Fundus: firm Incision: Dressing is clean, dry, and intact,  ABD: Purpuritic raised rash DVT Evaluation:  No evidence of DVT seen on physical exam. Labs:     Latest Ref Rng & Units 11/21/2020    4:30 PM  CMP  Glucose 65 - 99 mg/dL 83   BUN 7 - 25 mg/dL 17   Creatinine 9.62 - 0.97 mg/dL 9.52   Sodium 841 - 324 mmol/L 139   Potassium 3.5 - 5.3 mmol/L 4.5   Chloride 98 - 110 mmol/L 102   CO2 20 - 32 mmol/L 29   Calcium 8.6 - 10.2 mg/dL 9.8   Total Protein 6.1 - 8.1 g/dL 7.2   Total Bilirubin 0.2 - 1.2 mg/dL 0.6   AST 10 - 30 U/L 17   ALT 6 - 29 U/L 15    Edinburgh Score:    09/21/2022    6:18 PM  Edinburgh Postnatal Depression Scale Screening Tool  I have been able to laugh and see the funny side of things. 0  I have looked forward with enjoyment to things. 0  I have blamed myself unnecessarily when things went wrong. 0  I have been anxious or worried for no good reason. 0  I have felt scared or panicky for no good reason. 0  Things have been getting on top of me. 0  I have been so unhappy that I have had difficulty sleeping. 0  I have felt sad or  miserable. 0  I have been so unhappy that I have been crying. 0  The thought of harming myself has occurred to me. 0  Edinburgh Postnatal Depression Scale Total 0      After visit meds:  Allergies as of 09/23/2022       Reactions   Other    Either betadine or surgical adhesive may have caused a rash         Medication List     TAKE these medications    acetaminophen 500 MG tablet Commonly known as: TYLENOL Take 500 mg by mouth every 6 (six) hours as needed for moderate pain.   calamine lotion Apply 1 Application topically 2 (two) times daily.   hypromellose 0.3 % Gel ophthalmic ointment Commonly known as: GENTEAL Place 1 application into both eyes at bedtime.   ibuprofen 600 MG tablet Commonly known as: ADVIL Take 1 tablet (600 mg total) by mouth every 6 (six) hours as needed for moderate pain or cramping.   oxyCODONE-acetaminophen 5-325 MG tablet Commonly known as: Percocet Take 1 tablet by mouth every 4 (four)  hours as needed for up to 7 days.   oxymetazoline 0.05 % nasal spray Commonly known as: AFRIN Place 1 spray into both nostrils daily as needed for congestion.   PRENATAL VITAMIN PO Take 1 tablet by mouth daily.   SYSTANE OP Place 1 drop into both eyes daily as needed (dry eyes).         Discharge home in stable condition Infant Feeding: Breast Infant Disposition:home with mother Discharge instruction: per After Visit Summary and Postpartum booklet. Activity: Advance as tolerated. Pelvic rest for 6 weeks.  Diet: routine diet Anticipated Birth Control: Unsure Postpartum Appointment:6 weeks Additional Postpartum F/U: Postpartum Depression checkup and Incision check 1 week Future Appointments:No future appointments. Follow up Visit:  Follow-up Information     [facility]. Schedule an appointment as soon as possible for a visit.   Why: 1 week for incision check and 6 weeks for post partum visit Contact information: [address]  [address] [place] [address] [phone]                     09/23/2022 Cathrine Muster, DO

## 2022-09-23 NOTE — Progress Notes (Signed)
Subjective: Postpartum Day 2: Cesarean Delivery Patient reports incisional pain, tolerating PO, + flatus, + BM, and no problems voiding.  Pt reports spread of rash over entire abdomen - itchy. She called her dermatologist who rx'd triamcinolone ointment as hydrocortisone and clobatasol not helping much; triamcinolone helped in past. She is otherwise well - denies CP, SOB or lightheadedness. Had a BM today - had been concerned. Desires discharge home today   Objective: Vital signs in last 24 hours: Temp:  [97.9 F (36.6 C)-98.2 F (36.8 C)] 97.9 F (36.6 C) (05/23 0503) Pulse Rate:  [70-78] 78 (05/23 0503) Resp:  [16] 16 (05/23 0503) BP: (101-111)/(67-76) 101/67 (05/23 0503) SpO2:  [99 %] 99 % (05/22 1431)  Physical Exam:  General: alert, cooperative, and no distress Lochia: appropriate Uterine Fundus: firm Incision: no significant drainage, diffuse purpuritic raised rash mostly over incision area but spreading to cover abdominal and pelvic area consistent with abdominal prep site. No striae DVT Evaluation: No evidence of DVT seen on physical exam. No significant calf/ankle edema.  Recent Labs    09/22/22 0540 09/23/22 0603  HGB 10.0* 10.4*  HCT 28.7* 30.5*    Assessment/Plan: Status post Cesarean section. Postoperative course complicated by allergic skin rash otherwise pain well controlled.    - Discharge home with rx for caladryl sent. Can use intermittently with triamcinolone sent by dermatologist.  - Plan on incision check in 1 week ( vs 2 weeks) - remove dressing prior to 09/25/22 if rash worsens.  -Post op visit in 6 weeks   Ygnacio Fecteau W Izzac Rockett, DO 09/23/2022, 11:51 AM

## 2022-09-23 NOTE — Discharge Instructions (Signed)
Call office with any concerns [phone] 

## 2022-09-29 ENCOUNTER — Telehealth (HOSPITAL_COMMUNITY): Payer: Self-pay | Admitting: *Deleted

## 2022-09-29 NOTE — Telephone Encounter (Signed)
Mom reports feeling good. Incision healing well per mom, but mom reports rash from surgical prep. Will discuss with OB at tomorrow's appt. No other concerns regarding herself at this time. EPDS not completed. Mom reports feeling well emotionally (hospital score=0) Mom reports baby is well. Feeding, peeing, and pooping without difficulty. Mom has no concerns about baby at present.  Duffy Rhody, RN 09-29-2022 at 4;14pm

## 2023-03-15 ENCOUNTER — Encounter: Payer: BC Managed Care – PPO | Admitting: Family Medicine

## 2023-04-12 ENCOUNTER — Ambulatory Visit (INDEPENDENT_AMBULATORY_CARE_PROVIDER_SITE_OTHER): Payer: BC Managed Care – PPO | Admitting: Family Medicine

## 2023-04-12 ENCOUNTER — Encounter: Payer: Self-pay | Admitting: Family Medicine

## 2023-04-12 VITALS — BP 92/60 | HR 55 | Temp 98.0°F | Ht 64.75 in | Wt 105.8 lb

## 2023-04-12 DIAGNOSIS — Z13220 Encounter for screening for lipoid disorders: Secondary | ICD-10-CM

## 2023-04-12 DIAGNOSIS — Z0000 Encounter for general adult medical examination without abnormal findings: Secondary | ICD-10-CM | POA: Diagnosis not present

## 2023-04-12 LAB — CBC WITH DIFFERENTIAL/PLATELET
Basophils Absolute: 0.1 10*3/uL (ref 0.0–0.1)
Basophils Relative: 1.1 % (ref 0.0–3.0)
Eosinophils Absolute: 0.1 10*3/uL (ref 0.0–0.7)
Eosinophils Relative: 0.9 % (ref 0.0–5.0)
HCT: 37.2 % (ref 36.0–46.0)
Hemoglobin: 12.5 g/dL (ref 12.0–15.0)
Lymphocytes Relative: 27.2 % (ref 12.0–46.0)
Lymphs Abs: 2.4 10*3/uL (ref 0.7–4.0)
MCHC: 33.7 g/dL (ref 30.0–36.0)
MCV: 91.1 fL (ref 78.0–100.0)
Monocytes Absolute: 0.4 10*3/uL (ref 0.1–1.0)
Monocytes Relative: 5 % (ref 3.0–12.0)
Neutro Abs: 5.8 10*3/uL (ref 1.4–7.7)
Neutrophils Relative %: 65.8 % (ref 43.0–77.0)
Platelets: 278 10*3/uL (ref 150.0–400.0)
RBC: 4.08 Mil/uL (ref 3.87–5.11)
RDW: 12.4 % (ref 11.5–15.5)
WBC: 8.9 10*3/uL (ref 4.0–10.5)

## 2023-04-12 NOTE — Progress Notes (Signed)
Complete physical exam  Patient: Grace Zhang   DOB: 21-Jan-1991   32 y.o. Female  MRN: [id number]  Subjective:    Chief Complaint  Patient presents with   Annual Exam    Grace Zhang is a 32 y.o. female who presents today for a complete physical exam. She reports consuming a pescetarian diet. Home exercise routine includes walking 0.5 hrs per day. She generally feels well. She reports sleeping  Her infant is still not sleeping through the night, needs to get up with her infant. She does not have additional problems to discuss today.   Patient had a third C-section in May 24, then had umbilical hernia repair in July 24. Is currently still breastfeeding.   Most recent fall risk assessment:     No data to display           Most recent depression screenings:    04/12/2023    2:05 PM 05/29/2021    2:12 PM  PHQ 2/9 Scores  PHQ - 2 Score 0 0  PHQ- 9 Score 1 1    Vision:Within last year and sees Dr. Dione Booze, has hx of dry eye and Dental: No current dental problems and Receives regular dental care --Candee Furbish  Patient Active Problem List   Diagnosis Date Noted   History of cesarean section 09/21/2022   Periumbilical hernia 12/16/2021   Headache disorder 12/15/2021   Hypotension 12/15/2021      Patient Care Team: Karie Georges, MD as PCP - General (Family Medicine) Jodelle Red, MD as PCP - Cardiology (Cardiology) Sallye Lat, MD as Consulting Physician (Ophthalmology) Huel Cote, MD as Consulting Physician (Obstetrics and Gynecology) Caro Laroche, MD as Referring Physician (Dermatology) Toni Arthurs, MD as Consulting Physician (Orthopedic Surgery)   Outpatient Medications Prior to Visit  Medication Sig   calamine lotion Apply 1 Application topically 2 (two) times daily.   ibuprofen (ADVIL) 600 MG tablet Take 1 tablet (600 mg total) by mouth every 6 (six) hours as needed for moderate pain or cramping.   Polyethyl  Glycol-Propyl Glycol (SYSTANE OP) Place 1 drop into both eyes daily as needed (dry eyes).   Prenatal Vit-Fe Fumarate-FA (PRENATAL VITAMIN PO) Take 1 tablet by mouth daily.    [DISCONTINUED] acetaminophen (TYLENOL) 500 MG tablet Take 500 mg by mouth every 6 (six) hours as needed for moderate pain.   [DISCONTINUED] hypromellose (GENTEAL) 0.3 % GEL ophthalmic ointment Place 1 application into both eyes at bedtime.   [DISCONTINUED] oxyCODONE (OXY IR/ROXICODONE) 5 MG immediate release tablet Take 1-2 tablets (5-10 mg total) by mouth every 4 (four) hours as needed for severe pain or breakthrough pain.   [DISCONTINUED] oxymetazoline (AFRIN) 0.05 % nasal spray Place 1 spray into both nostrils daily as needed for congestion.   No facility-administered medications prior to visit.    Review of Systems  HENT:  Negative for hearing loss.   Eyes:  Negative for blurred vision.  Respiratory:  Negative for shortness of breath.   Cardiovascular:  Negative for chest pain.  Gastrointestinal: Negative.   Genitourinary: Negative.   Musculoskeletal:  Negative for back pain.  Neurological:  Negative for headaches.  Psychiatric/Behavioral:  Negative for depression.   All other systems reviewed and are negative.      Objective:     BP 92/60 (BP Location: Left Arm, Patient Position: Sitting, Cuff Size: Normal)   Pulse (!) 55   Temp 98 F (36.7 C) (Oral)   Ht 5' 4.75" (1.645 m)   Wt  105 lb 12.8 oz (48 kg)   SpO2 99%   Breastfeeding Yes   BMI 17.74 kg/m    Physical Exam Vitals reviewed.  Constitutional:      Appearance: Normal appearance. She is well-groomed and normal weight.  HENT:     Right Ear: Tympanic membrane and ear canal normal.     Left Ear: Tympanic membrane and ear canal normal.     Mouth/Throat:     Mouth: Mucous membranes are moist.     Pharynx: No posterior oropharyngeal erythema.  Eyes:     Conjunctiva/sclera: Conjunctivae normal.  Neck:     Thyroid: No thyromegaly.   Cardiovascular:     Rate and Rhythm: Normal rate and regular rhythm.     Pulses: Normal pulses.     Heart sounds: S1 normal and S2 normal.  Pulmonary:     Effort: Pulmonary effort is normal.     Breath sounds: Normal breath sounds and air entry.  Abdominal:     General: Abdomen is flat. Bowel sounds are normal.     Palpations: Abdomen is soft.  Musculoskeletal:     Right lower leg: No edema.     Left lower leg: No edema.  Lymphadenopathy:     Cervical: No cervical adenopathy.  Neurological:     Mental Status: She is alert and oriented to person, place, and time. Mental status is at baseline.     Gait: Gait is intact.  Psychiatric:        Mood and Affect: Mood and affect normal.        Speech: Speech normal.        Behavior: Behavior normal.        Judgment: Judgment normal.      No results found for any visits on 04/12/23.     Assessment & Plan:    Routine Health Maintenance and Physical Exam  Immunization History  Administered Date(s) Administered   HPV Quadrivalent 04/06/2005   Influenza,inj,Quad PF,6+ Mos 01/26/2017   Influenza-Unspecified 02/01/2015, 01/21/2021, 01/02/2023   PFIZER(Purple Top)SARS-COV-2 Vaccination 07/11/2019, 08/02/2019, 03/22/2020, 09/09/2020   Pfizer Covid-19 Vaccine Bivalent Booster 67yrs & up 01/21/2021   Tdap 09/01/2007, 04/20/2016    Health Maintenance  Topic Date Due   HPV VACCINES (2 - 3-dose series) 05/04/2005   Cervical Cancer Screening (HPV/Pap Cotest)  04/20/2021   COVID-19 Vaccine (6 - 2023-24 season) 01/02/2023   DTaP/Tdap/Td (3 - Td or Tdap) 04/20/2026   INFLUENZA VACCINE  Completed   Hepatitis C Screening  Completed   HIV Screening  Completed    Discussed health benefits of physical activity, and encouraged her to engage in regular exercise appropriate for her age and condition.  Lipid screening -     Lipid panel; Future  Routine general medical examination at a health care facility -     CBC with  Differential/Platelet -     Comprehensive metabolic panel   Normal physical exam findings today, pt was concerned about 0.25 inch loss of height, we discussed this and I counseled the patient to monitor this at home-- if she develops any bone pain or continues to lose height then will order additional testing. Handouts given on healthy eating and exercise. Labs ordered for annual surveillance.   Return in 1 year (on 04/11/2024).     Karie Georges, MD

## 2023-04-12 NOTE — Patient Instructions (Addendum)
Nasacort spray, saline spray or nettie pot for sinus rinsing.  Health Maintenance, Female Adopting a healthy lifestyle and getting preventive care are important in promoting health and wellness. Ask your health care provider about: The right schedule for you to have regular tests and exams. Things you can do on your own to prevent diseases and keep yourself healthy. What should I know about diet, weight, and exercise? Eat a healthy diet  Eat a diet that includes plenty of vegetables, fruits, low-fat dairy products, and lean protein. Do not eat a lot of foods that are high in solid fats, added sugars, or sodium. Maintain a healthy weight Body mass index (BMI) is used to identify weight problems. It estimates body fat based on height and weight. Your health care provider can help determine your BMI and help you achieve or maintain a healthy weight. Get regular exercise Get regular exercise. This is one of the most important things you can do for your health. Most adults should: Exercise for at least 150 minutes each week. The exercise should increase your heart rate and make you sweat (moderate-intensity exercise). Do strengthening exercises at least twice a week. This is in addition to the moderate-intensity exercise. Spend less time sitting. Even light physical activity can be beneficial. Watch cholesterol and blood lipids Have your blood tested for lipids and cholesterol at 34 years of age, then have this test every 5 years. Have your cholesterol levels checked more often if: Your lipid or cholesterol levels are high. You are older than 34 years of age. You are at high risk for heart disease. What should I know about cancer screening? Depending on your health history and family history, you may need to have cancer screening at various ages. This may include screening for: Breast cancer. Cervical cancer. Colorectal cancer. Skin cancer. Lung cancer. What should I know about heart  disease, diabetes, and high blood pressure? Blood pressure and heart disease High blood pressure causes heart disease and increases the risk of stroke. This is more likely to develop in people who have high blood pressure readings or are overweight. Have your blood pressure checked: Every 3-5 years if you are 66-66 years of age. Every year if you are 27 years old or older. Diabetes Have regular diabetes screenings. This checks your fasting blood sugar level. Have the screening done: Once every three years after age 95 if you are at a normal weight and have a low risk for diabetes. More often and at a younger age if you are overweight or have a high risk for diabetes. What should I know about preventing infection? Hepatitis B If you have a higher risk for hepatitis B, you should be screened for this virus. Talk with your health care provider to find out if you are at risk for hepatitis B infection. Hepatitis C Testing is recommended for: Everyone born from [birth year] through 1965. Anyone with known risk factors for hepatitis C. Sexually transmitted infections (STIs) Get screened for STIs, including gonorrhea and chlamydia, if: You are sexually active and are younger than 34 years of age. You are older than 34 years of age and your health care provider tells you that you are at risk for this type of infection. Your sexual activity has changed since you were last screened, and you are at increased risk for chlamydia or gonorrhea. Ask your health care provider if you are at risk. Ask your health care provider about whether you are at high risk for HIV. Your health  care provider may recommend a prescription medicine to help prevent HIV infection. If you choose to take medicine to prevent HIV, you should first get tested for HIV. You should then be tested every 3 months for as long as you are taking the medicine. Pregnancy If you are about to stop having your period (premenopausal) and you may become  pregnant, seek counseling before you get pregnant. Take 400 to 800 micrograms (mcg) of folic acid every day if you become pregnant. Ask for birth control (contraception) if you want to prevent pregnancy. Osteoporosis and menopause Osteoporosis is a disease in which the bones lose minerals and strength with aging. This can result in bone fractures. If you are 74 years old or older, or if you are at risk for osteoporosis and fractures, ask your health care provider if you should: Be screened for bone loss. Take a calcium or vitamin D supplement to lower your risk of fractures. Be given hormone replacement therapy (HRT) to treat symptoms of menopause. Follow these instructions at home: Alcohol use Do not drink alcohol if: Your health care provider tells you not to drink. You are pregnant, may be pregnant, or are planning to become pregnant. If you drink alcohol: Limit how much you have to: 0-1 drink a day. Know how much alcohol is in your drink. In the [place] one drink equals one 12 oz bottle of beer (355 mL), one 5 oz glass of wine (148 mL), or one 1 oz glass of hard liquor (44 mL). Lifestyle Do not use any products that contain nicotine or tobacco. These products include cigarettes, chewing tobacco, and vaping devices, such as e-cigarettes. If you need help quitting, ask your health care provider. Do not use street drugs. Do not share needles. Ask your health care provider for help if you need support or information about quitting drugs. General instructions Schedule regular health, dental, and eye exams. Stay current with your vaccines. Tell your health care provider if: You often feel depressed. You have ever been abused or do not feel safe at home. Summary Adopting a healthy lifestyle and getting preventive care are important in promoting health and wellness. Follow your health care provider's instructions about healthy diet, exercising, and getting tested or screened for  diseases. Follow your health care provider's instructions on monitoring your cholesterol and blood pressure. This information is not intended to replace advice given to you by your health care provider. Make sure you discuss any questions you have with your health care provider. Document Revised: 09/08/2020 Document Reviewed: 09/08/2020 Elsevier Patient Education  2024 [employer].

## 2023-04-13 LAB — COMPREHENSIVE METABOLIC PANEL
ALT: 14 U/L (ref 0–35)
AST: 19 U/L (ref 0–37)
Albumin: 4.6 g/dL (ref 3.5–5.2)
Alkaline Phosphatase: 79 U/L (ref 39–117)
BUN: 15 mg/dL (ref 6–23)
CO2: 30 meq/L (ref 19–32)
Calcium: 9.5 mg/dL (ref 8.4–10.5)
Chloride: 101 meq/L (ref 96–112)
Creatinine, Ser: 0.73 mg/dL (ref 0.40–1.20)
GFR: 107.52 mL/min (ref >60.00)
Glucose, Bld: 71 mg/dL (ref 70–99)
Potassium: 4 meq/L (ref 3.5–5.1)
Sodium: 139 meq/L (ref 135–145)
Total Bilirubin: 0.7 mg/dL (ref 0.2–1.2)
Total Protein: 6.8 g/dL (ref 6.0–8.3)

## 2023-04-13 LAB — LIPID PANEL
Cholesterol: 135 mg/dL (ref 0–200)
HDL: 70.2 mg/dL (ref >39.00)
LDL Cholesterol: 55 mg/dL (ref 0–99)
NonHDL: 64.31
Total CHOL/HDL Ratio: 2
Triglycerides: 47 mg/dL (ref 0.0–149.0)
VLDL: 9.4 mg/dL (ref 0.0–40.0)

## 2023-06-27 ENCOUNTER — Encounter: Payer: Self-pay | Admitting: Family Medicine

## 2023-07-08 ENCOUNTER — Ambulatory Visit (INDEPENDENT_AMBULATORY_CARE_PROVIDER_SITE_OTHER): Payer: BC Managed Care – PPO | Admitting: Family Medicine

## 2023-07-08 ENCOUNTER — Encounter: Payer: Self-pay | Admitting: Family Medicine

## 2023-07-08 VITALS — BP 90/60 | HR 64 | Temp 97.4°F | Wt 105.8 lb

## 2023-07-08 DIAGNOSIS — R2689 Other abnormalities of gait and mobility: Secondary | ICD-10-CM

## 2023-07-08 NOTE — Progress Notes (Signed)
 Established Patient Office Visit  Subjective   Patient ID: Grace Zhang, female    DOB: Mar 18, 1991  Age: 33 y.o. MRN: [id number]  Chief Complaint  Patient presents with   Fall    Patient complains of fall, x2 weeks    Gait Problem    Patient complains of gait issue, x1 month     Patient states that she is having new symptoms of fall and abnormal gait. States that she was going down the stairs and she tripped and fell. States that she did injure her ankle at the time and was seen by the orthopedist. States that another time she was getting out of the car and when she turned around to shut the door she lost her footing and fell backwards. No head trauma, states that she has never actually fainted, but does report when she stands up to fast she gets dizzy. There is no "veering" to one side or the other when walking. States that she finds she is not as able to keep her balance. No changes in appetite, nutrition, states that she is still nursing  baby is still not sleeping through the night. States that she feels chronically fatigued due to the broken sleep, only getting about 5 hours or so during the week.     Current Outpatient Medications  Medication Instructions   calamine lotion 1 Application, Topical, 2 times daily   ibuprofen (ADVIL) 600 mg, Oral, Every 6 hours PRN   Polyethyl Glycol-Propyl Glycol (SYSTANE OP) 1 drop, Both Eyes, Daily PRN   Prenatal Vit-Fe Fumarate-FA (PRENATAL VITAMIN PO) 1 tablet, Oral, Daily    Patient Active Problem List   Diagnosis Date Noted   History of cesarean section 09/21/2022   Periumbilical hernia 12/16/2021   Headache disorder 12/15/2021   Hypotension 12/15/2021      Review of Systems  Constitutional:  Negative for chills and fever.  HENT:  Negative for hearing loss and tinnitus.   Neurological:  Positive for dizziness. Negative for tingling, sensory change, speech change, focal weakness and weakness.  All other systems reviewed and are  negative.     Objective:     BP 90/60 (BP Location: Left Arm, Patient Position: Sitting, Cuff Size: Normal)   Pulse 64   Temp (!) 97.4 F (36.3 C) (Oral)   Wt 105 lb 12.8 oz (48 kg)   SpO2 98%   BMI 17.74 kg/m    Physical Exam Vitals reviewed.  Constitutional:      Appearance: Normal appearance. She is normal weight.  Neurological:     Mental Status: She is alert and oriented to person, place, and time.     Cranial Nerves: Cranial nerves 2-12 are intact. No cranial nerve deficit.     Sensory: Sensation is intact.     Motor: Motor function is intact. No weakness or tremor.     Coordination: Coordination is intact. Romberg sign negative.      No results found for any visits on 07/08/23.    The ASCVD Risk score (Arnett DK, et al., 2019) failed to calculate for the following reasons:   The 2019 ASCVD risk score is only valid for ages 36 to 27    Assessment & Plan:  Balance disorder -     Vitamin B12; Future -     Iron, TIBC and Ferritin Panel; Future   Unclear etiology, I will check B12 and iron levels to rule out a neuropathic cause. We discussed further steps, possible need for referral and  other non neurologic causes. Since her neuro exam is normal I would not recommend any imaiging. If her sx worsen then would consider referral to neurologist.  No follow-ups on file.    Karie Georges, MD

## 2023-07-11 ENCOUNTER — Other Ambulatory Visit

## 2023-07-12 ENCOUNTER — Other Ambulatory Visit (INDEPENDENT_AMBULATORY_CARE_PROVIDER_SITE_OTHER)

## 2023-07-12 DIAGNOSIS — R2689 Other abnormalities of gait and mobility: Secondary | ICD-10-CM | POA: Diagnosis not present

## 2023-07-12 LAB — VITAMIN B12: Vitamin B-12: 812 pg/mL (ref 211–911)

## 2023-07-13 ENCOUNTER — Encounter: Payer: Self-pay | Admitting: Family Medicine

## 2023-07-13 LAB — IRON,TIBC AND FERRITIN PANEL
%SAT: 24 % (ref 16–45)
Ferritin: 31 ng/mL (ref 16–154)
Iron: 74 ug/dL (ref 40–190)
TIBC: 306 ug/dL (ref 250–450)

## 2023-07-22 IMAGING — DX DG TOE 5TH 2+V*L*
3 series · 3 of 3 positions shown · non-contrast
Comparison: None.

CLINICAL DATA: Fifth toe pain

EXAM:
DG TOE 5TH LEFT

[toes ap]
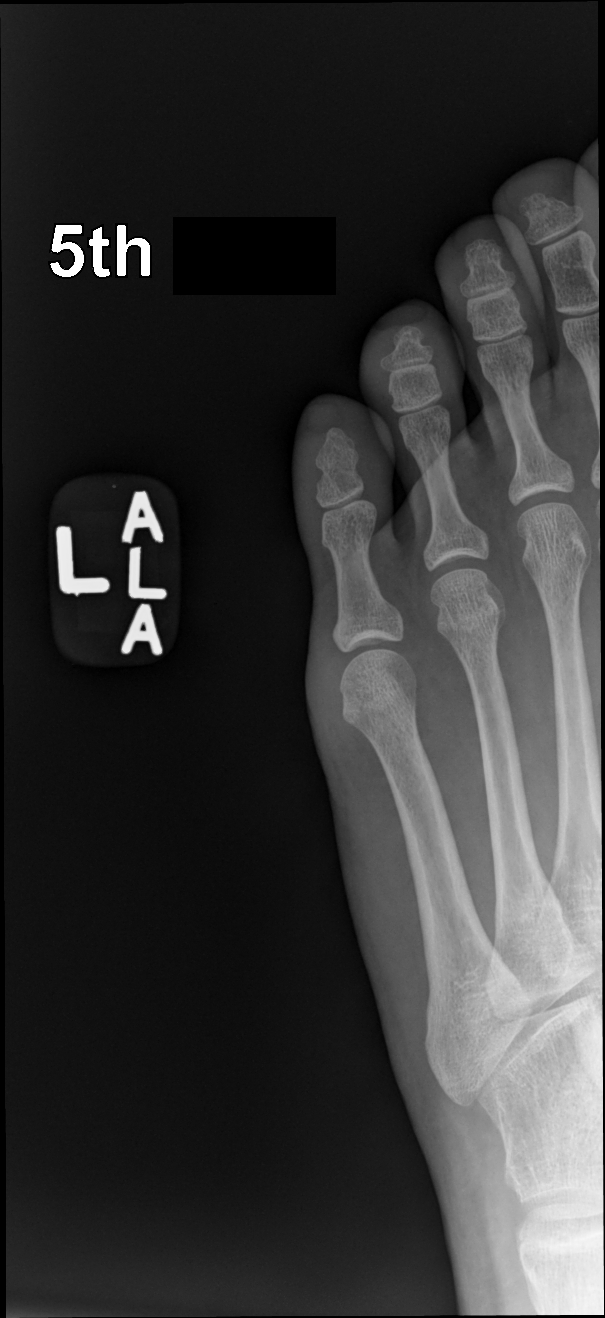

[toes mlo]
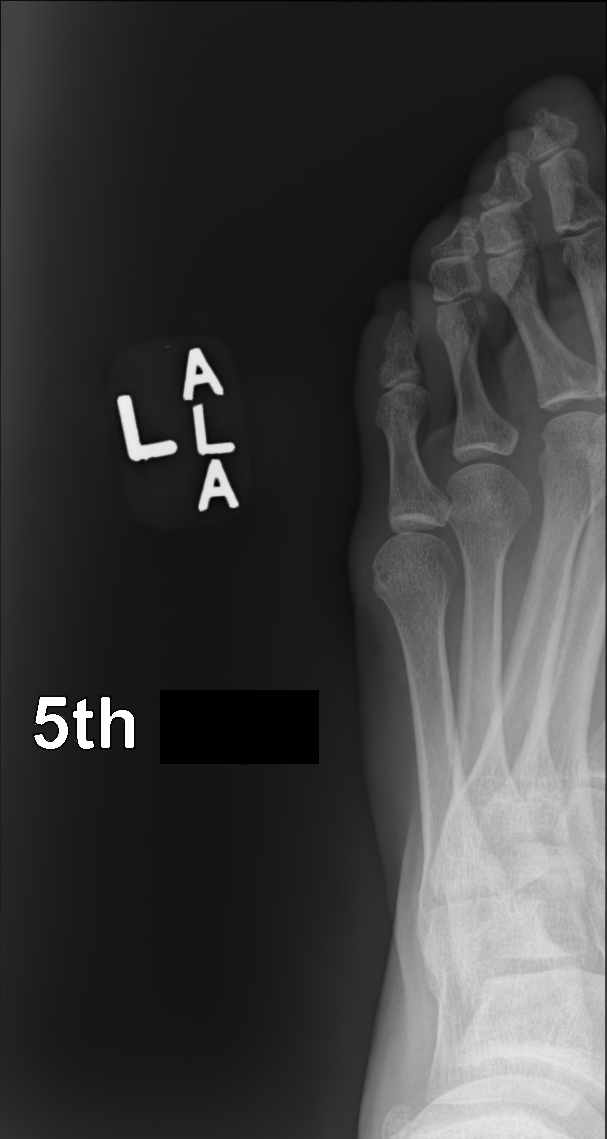

[toes lat]
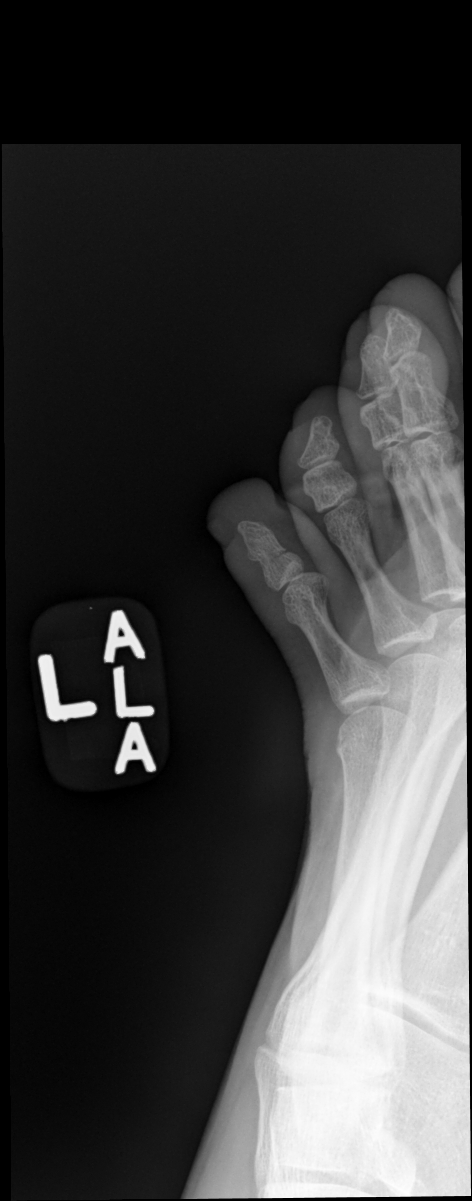

[3 of 3 positions shown; findings below may reference images not displayed]

FINDINGS: Fusion of the fifth middle and distal phalanges. There is an acute
nondisplaced fracture involving the midportion of the fifth distal
phalanx. No subluxation.
IMPRESSION: Acute appearing nondisplaced fracture involving the midportion of
fifth distal phalanx, note that the middle and distal phalanges of
the fifth digit are fused.

## 2023-12-22 ENCOUNTER — Encounter: Payer: Self-pay | Admitting: Family Medicine

## 2024-04-12 ENCOUNTER — Encounter: Payer: BC Managed Care – PPO | Admitting: Family Medicine

## 2024-04-12 ENCOUNTER — Ambulatory Visit (INDEPENDENT_AMBULATORY_CARE_PROVIDER_SITE_OTHER): Admitting: Family Medicine

## 2024-04-12 VITALS — BP 100/60 | HR 78 | Temp 97.4°F | Ht 64.75 in | Wt 112.5 lb

## 2024-04-12 DIAGNOSIS — E611 Iron deficiency: Secondary | ICD-10-CM

## 2024-04-12 DIAGNOSIS — R636 Underweight: Secondary | ICD-10-CM

## 2024-04-12 DIAGNOSIS — Z8262 Family history of osteoporosis: Secondary | ICD-10-CM

## 2024-04-12 DIAGNOSIS — R29890 Loss of height: Secondary | ICD-10-CM | POA: Diagnosis not present

## 2024-04-12 DIAGNOSIS — Z13220 Encounter for screening for lipoid disorders: Secondary | ICD-10-CM

## 2024-04-12 DIAGNOSIS — F419 Anxiety disorder, unspecified: Secondary | ICD-10-CM | POA: Diagnosis not present

## 2024-04-12 DIAGNOSIS — Z0000 Encounter for general adult medical examination without abnormal findings: Secondary | ICD-10-CM | POA: Diagnosis not present

## 2024-04-12 NOTE — Progress Notes (Signed)
 Complete physical exam  Patient: Grace Zhang   DOB: 1990/12/28   33 y.o. Female  MRN: [id number]  Subjective:    Chief Complaint  Patient presents with   Annual Exam    Grace Zhang is a 33 y.o. female who presents today for a complete physical exam. She reports consuming a general diet. Eats fish, eggs, does not eat chicken, eats also dairy. Home exercise routine includes walking 1-2 hrs per week and also rides a stationary bike twice a week. She generally feels well. She reports sleeping poorly. She reports she is working a lot very early and only gets about 5 1/2 hours of broken sleep, also has small children at home  She does have additional problems to discuss today.   Pt is reporting a loss of height over that last 2 years. She states that she usually 5'5 1/2 however today she is measuring 5'4 and 3/4.  Most recent fall risk assessment:     No data to display           Most recent depression screenings:    04/12/2024    3:16 PM 04/12/2023    2:05 PM  PHQ 2/9 Scores  PHQ - 2 Score 0 0  PHQ- 9 Score  1      Data saved with a previous flowsheet row definition    Vision:Within last year and Dental: No current dental problems and Receives regular dental care  Patient Active Problem List   Diagnosis Date Noted   History of cesarean section 09/21/2022   Periumbilical hernia 12/16/2021   Headache disorder 12/15/2021   Hypotension 12/15/2021      Patient Care Team: Ozell Heron HERO, MD as PCP - General (Family Medicine) Lonni Slain, MD as PCP - Cardiology (Cardiology) Octavia Lonni, MD as Consulting Physician (Ophthalmology) Estelle Service, MD as Consulting Physician (Obstetrics and Gynecology) Dietrich Alyce BROCKS, MD as Referring Physician (Dermatology) Kit Rush, MD as Consulting Physician (Orthopedic Surgery)   Show/hide medication list[1]  Review of Systems  HENT:  Negative for hearing loss.   Eyes:  Negative for blurred vision.   Respiratory:  Negative for shortness of breath.   Cardiovascular:  Positive for chest pain.  Gastrointestinal: Negative.   Genitourinary: Negative.   Musculoskeletal:  Negative for back pain.  Neurological:  Negative for headaches.  Psychiatric/Behavioral:  Negative for depression.        Objective:     BP 100/60 (BP Location: Right Arm, Patient Position: Sitting, Cuff Size: Normal)   Pulse 78   Temp (!) 97.4 F (36.3 C) (Oral)   Ht 5' 4.75 (1.645 m)   Wt 112 lb 8 oz (51 kg)   LMP 04/09/2024 (Exact Date)   SpO2 99%   Breastfeeding Yes   BMI 18.87 kg/m    Physical Exam Vitals reviewed.  Constitutional:      Appearance: Normal appearance. She is well-groomed and normal weight.  HENT:     Right Ear: Tympanic membrane and ear canal normal.     Left Ear: Tympanic membrane and ear canal normal.     Mouth/Throat:     Mouth: Mucous membranes are moist.     Pharynx: No posterior oropharyngeal erythema.  Eyes:     Conjunctiva/sclera: Conjunctivae normal.  Neck:     Thyroid : No thyromegaly.  Cardiovascular:     Rate and Rhythm: Normal rate and regular rhythm.     Pulses: Normal pulses.     Heart sounds: S1 normal and S2 normal.  Pulmonary:     Effort: Pulmonary effort is normal.     Breath sounds: Normal breath sounds and air entry.  Abdominal:     General: Abdomen is flat. Bowel sounds are normal.     Palpations: Abdomen is soft.  Musculoskeletal:     Right lower leg: No edema.     Left lower leg: No edema.  Lymphadenopathy:     Cervical: No cervical adenopathy.  Neurological:     Mental Status: She is alert and oriented to person, place, and time. Mental status is at baseline.     Gait: Gait is intact.  Psychiatric:        Mood and Affect: Mood and affect normal.        Speech: Speech normal.        Behavior: Behavior normal.        Judgment: Judgment normal.      No results found for any visits on 04/12/24.     Assessment & Plan:    Routine Health  Maintenance and Physical Exam  Immunization History  Administered Date(s) Administered   HPV Quadrivalent 04/06/2005   Influenza Inj Mdck Quad Pf 02/19/2020, 02/02/2022   Influenza, Mdck, Trivalent,PF 6+ MOS(egg free) 02/07/2024   Influenza, Seasonal, Injecte, Preservative Fre 01/28/2023   Influenza,inj,Quad PF,6+ Mos 01/26/2017, 01/09/2018, 02/02/2019   Influenza,inj,quad, With Preservative 02/02/2019   Influenza-Unspecified 02/01/2015, 01/21/2021, 01/02/2023   PFIZER Comirnaty(Gray Top)Covid-19 Tri-Sucrose Vaccine 09/09/2020   PFIZER(Purple Top)SARS-COV-2 Vaccination 07/11/2019, 08/02/2019, 03/22/2020, 09/09/2020   Pfizer Covid-19 Vaccine Bivalent Booster 73yrs & up 01/21/2021   Pfizer(Comirnaty)Fall Seasonal Vaccine 12 years and older 01/28/2023   Tdap 09/01/2007, 04/20/2016, 02/15/2018, 03/18/2020, 06/30/2022   Unspecified SARS-COV-2 Vaccination 07/11/2019, 08/02/2019    Health Maintenance  Topic Date Due   COVID-19 Vaccine (9 - 2025-26 season) 04/28/2024 (Originally 01/02/2024)   Hepatitis B Vaccines 19-59 Average Risk (1 of 3 - 19+ 3-dose series) 04/12/2025 (Originally 02/24/2008)   HPV VACCINES (2 - 3-dose series) 04/12/2025 (Originally 05/04/2005)   Cervical Cancer Screening (HPV/Pap Cotest)  09/10/2026   DTaP/Tdap/Td (6 - Td or Tdap) 06/30/2032   Influenza Vaccine  Completed   Hepatitis C Screening  Completed   HIV Screening  Completed   Pneumococcal Vaccine  Aged Out   Meningococcal B Vaccine  Aged Out    Discussed health benefits of physical activity, and encouraged her to engage in regular exercise appropriate for her age and condition.  Lipid screening -     Lipid panel; Future  Routine general medical examination at a health care facility -     Comprehensive metabolic panel with GFR; Future  Iron  deficiency -     CBC with Differential/Platelet; Future  Family history of osteoporosis -     VITAMIN D  25 Hydroxy (Vit-D Deficiency, Fractures); Future  Loss of  height -     VITAMIN D  25 Hydroxy (Vit-D Deficiency, Fractures); Future -     DG Bone Density; Future  Anxiety -     Cortisol; Future  Underweight  General physical exam findings are normal today. Her BMI is slightly underweight, and she has a family history of osteoporosis. Pt is reporting loss of height over the last couple of years, states that she is not having any back pain or other issues. Will order vitamin D  level and send her for bone density testing.    I reviewed the patient's preventative testing, immunizations, and lifestyle habits. I made appropriate recommendations and placed orders for the appropriate tests and/or vaccinations. I  counseled the patient on the CDC's recommendations for healthy exercise and diet. I counseled the patient on healthy sleep habits and stress management. Handouts to reinforce the counseling were given at the conclusion of the visit.    Return in about 1 year (around 04/12/2025) for annual physical exam.     Heron CHRISTELLA Sharper, MD     [1]  Outpatient Medications Prior to Visit  Medication Sig   ibuprofen  (ADVIL ) 600 MG tablet Take 1 tablet (600 mg total) by mouth every 6 (six) hours as needed for moderate pain or cramping.   Polyethyl Glycol-Propyl Glycol (SYSTANE OP) Place 1 drop into both eyes daily as needed (dry eyes).   Prenatal Vit-Fe Fumarate-FA (PRENATAL VITAMIN PO) Take 1 tablet by mouth daily.    calamine lotion Apply 1 Application topically 2 (two) times daily. (Patient not taking: Reported on 04/12/2024)   No facility-administered medications prior to visit.

## 2024-04-13 LAB — COMPREHENSIVE METABOLIC PANEL WITH GFR
ALT: 15 U/L (ref 0–35)
AST: 18 U/L (ref 0–37)
Albumin: 4.7 g/dL (ref 3.5–5.2)
Alkaline Phosphatase: 61 U/L (ref 39–117)
BUN: 16 mg/dL (ref 6–23)
CO2: 31 meq/L (ref 19–32)
Calcium: 9.7 mg/dL (ref 8.4–10.5)
Chloride: 101 meq/L (ref 96–112)
Creatinine, Ser: 0.71 mg/dL (ref 0.40–1.20)
GFR: 110.38 mL/min (ref >60.00)
Glucose, Bld: 84 mg/dL (ref 70–99)
Potassium: 3.8 meq/L (ref 3.5–5.1)
Sodium: 140 meq/L (ref 135–145)
Total Bilirubin: 0.4 mg/dL (ref 0.2–1.2)
Total Protein: 7.2 g/dL (ref 6.0–8.3)

## 2024-04-13 LAB — CBC WITH DIFFERENTIAL/PLATELET
Basophils Absolute: 0.1 K/uL (ref 0.0–0.1)
Basophils Relative: 1.1 % (ref 0.0–3.0)
Eosinophils Absolute: 0.2 K/uL (ref 0.0–0.7)
Eosinophils Relative: 1.9 % (ref 0.0–5.0)
HCT: 37.2 % (ref 36.0–46.0)
Hemoglobin: 12.7 g/dL (ref 12.0–15.0)
Lymphocytes Relative: 29.9 % (ref 12.0–46.0)
Lymphs Abs: 2.5 K/uL (ref 0.7–4.0)
MCHC: 34.1 g/dL (ref 30.0–36.0)
MCV: 90.4 fl (ref 78.0–100.0)
Monocytes Absolute: 0.4 K/uL (ref 0.1–1.0)
Monocytes Relative: 4.4 % (ref 3.0–12.0)
Neutro Abs: 5.2 K/uL (ref 1.4–7.7)
Neutrophils Relative %: 62.7 % (ref 43.0–77.0)
Platelets: 250 K/uL (ref 150.0–400.0)
RBC: 4.12 Mil/uL (ref 3.87–5.11)
RDW: 13 % (ref 11.5–15.5)
WBC: 8.3 K/uL (ref 4.0–10.5)

## 2024-04-13 LAB — LIPID PANEL
Cholesterol: 158 mg/dL (ref 0–200)
HDL: 76.4 mg/dL (ref >39.00)
LDL Cholesterol: 68 mg/dL (ref 0–99)
NonHDL: 81.7
Total CHOL/HDL Ratio: 2
Triglycerides: 71 mg/dL (ref 0.0–149.0)
VLDL: 14.2 mg/dL (ref 0.0–40.0)

## 2024-04-13 LAB — VITAMIN D 25 HYDROXY (VIT D DEFICIENCY, FRACTURES): VITD: 25.82 ng/mL — ABNORMAL LOW (ref 30.00–100.00)

## 2024-04-16 ENCOUNTER — Inpatient Hospital Stay
Admission: RE | Admit: 2024-04-16 | Discharge: 2024-04-16 | Disposition: A | Source: Ambulatory Visit | Attending: Family Medicine

## 2024-04-16 DIAGNOSIS — R29890 Loss of height: Secondary | ICD-10-CM

## 2024-04-17 ENCOUNTER — Encounter: Payer: Self-pay | Admitting: Family Medicine

## 2024-04-17 ENCOUNTER — Other Ambulatory Visit

## 2024-04-18 ENCOUNTER — Ambulatory Visit: Payer: Self-pay | Admitting: Family Medicine

## 2024-04-25 ENCOUNTER — Other Ambulatory Visit (INDEPENDENT_AMBULATORY_CARE_PROVIDER_SITE_OTHER)

## 2024-04-25 DIAGNOSIS — F419 Anxiety disorder, unspecified: Secondary | ICD-10-CM

## 2024-04-25 LAB — CORTISOL: Cortisol, Plasma: 12.6 ug/dL

## 2024-05-04 ENCOUNTER — Other Ambulatory Visit: Payer: Self-pay | Admitting: General Surgery

## 2024-05-04 DIAGNOSIS — K432 Incisional hernia without obstruction or gangrene: Secondary | ICD-10-CM

## 2024-05-22 ENCOUNTER — Ambulatory Visit
Admission: RE | Admit: 2024-05-22 | Discharge: 2024-05-22 | Disposition: A | Source: Ambulatory Visit | Attending: General Surgery | Admitting: General Surgery

## 2024-05-22 DIAGNOSIS — K432 Incisional hernia without obstruction or gangrene: Secondary | ICD-10-CM

## 2025-04-15 ENCOUNTER — Encounter: Admitting: Family Medicine
